# Patient Record
Sex: Male | Born: 1955
Health system: Southern US, Community
[De-identification: ages and names within clinical notes are randomized; demographics above are authoritative.]

## PROBLEM LIST (undated history)

## (undated) DIAGNOSIS — I10 Essential (primary) hypertension: Secondary | ICD-10-CM

## (undated) DIAGNOSIS — E785 Hyperlipidemia, unspecified: Secondary | ICD-10-CM

## (undated) HISTORY — DX: Hyperlipidemia, unspecified: E78.5

## (undated) HISTORY — DX: Essential (primary) hypertension: I10

---

## 1998-12-30 ENCOUNTER — Encounter: Payer: Self-pay | Admitting: Emergency Medicine

## 1998-12-30 ENCOUNTER — Encounter: Payer: Self-pay | Admitting: Specialist

## 1998-12-30 ENCOUNTER — Inpatient Hospital Stay (HOSPITAL_COMMUNITY): Admission: EM | Admit: 1998-12-30 | Discharge: 1999-01-01 | Payer: Self-pay | Admitting: Emergency Medicine

## 2001-03-06 ENCOUNTER — Encounter: Payer: Self-pay | Admitting: Family Medicine

## 2001-03-08 ENCOUNTER — Ambulatory Visit (HOSPITAL_COMMUNITY): Admission: RE | Admit: 2001-03-08 | Discharge: 2001-03-08 | Payer: Self-pay | Admitting: Gastroenterology

## 2004-09-09 ENCOUNTER — Ambulatory Visit: Payer: Self-pay | Admitting: Family Medicine

## 2004-12-26 ENCOUNTER — Ambulatory Visit: Payer: Self-pay | Admitting: Sports Medicine

## 2005-02-08 ENCOUNTER — Ambulatory Visit: Payer: Self-pay | Admitting: Family Medicine

## 2005-08-14 ENCOUNTER — Encounter: Admission: RE | Admit: 2005-08-14 | Discharge: 2005-08-14 | Payer: Self-pay | Admitting: Specialist

## 2005-11-21 ENCOUNTER — Ambulatory Visit: Payer: Self-pay | Admitting: Family Medicine

## 2006-03-23 ENCOUNTER — Ambulatory Visit: Payer: Self-pay | Admitting: Family Medicine

## 2006-03-23 ENCOUNTER — Encounter (INDEPENDENT_AMBULATORY_CARE_PROVIDER_SITE_OTHER): Payer: Self-pay | Admitting: Family Medicine

## 2006-03-23 LAB — CONVERTED CEMR LAB
BUN: 10 mg/dL (ref 6–23)
CO2: 28 meq/L (ref 19–32)
Calcium: 9.7 mg/dL (ref 8.4–10.5)
Chloride: 102 meq/L (ref 96–112)
Creatinine, Ser: 0.97 mg/dL (ref 0.40–1.50)
Creatinine, Urine: 49.8 mg/dL
Glucose, Bld: 81 mg/dL (ref 70–99)
Microalb Creat Ratio: 6.2 mg/g (ref 0.0–30.0)
Microalb, Ur: 0.31 mg/dL (ref 0.00–1.89)
Potassium: 4.2 meq/L (ref 3.5–5.3)
Sodium: 140 meq/L (ref 135–145)

## 2006-03-29 DIAGNOSIS — E7800 Pure hypercholesterolemia, unspecified: Secondary | ICD-10-CM

## 2006-03-29 DIAGNOSIS — I10 Essential (primary) hypertension: Secondary | ICD-10-CM

## 2006-03-29 DIAGNOSIS — E669 Obesity, unspecified: Secondary | ICD-10-CM | POA: Insufficient documentation

## 2006-09-12 ENCOUNTER — Ambulatory Visit: Payer: Self-pay | Admitting: Family Medicine

## 2006-09-12 DIAGNOSIS — R808 Other proteinuria: Secondary | ICD-10-CM | POA: Insufficient documentation

## 2006-09-12 HISTORY — DX: Other proteinuria: R80.8

## 2006-09-17 ENCOUNTER — Encounter: Payer: Self-pay | Admitting: *Deleted

## 2006-09-26 ENCOUNTER — Encounter (INDEPENDENT_AMBULATORY_CARE_PROVIDER_SITE_OTHER): Payer: Self-pay | Admitting: Family Medicine

## 2006-09-26 ENCOUNTER — Ambulatory Visit: Payer: Self-pay | Admitting: Family Medicine

## 2006-09-26 LAB — CONVERTED CEMR LAB: Microalbumin U total vol: NEGATIVE mg/L

## 2006-09-27 ENCOUNTER — Telehealth: Payer: Self-pay | Admitting: *Deleted

## 2006-09-27 ENCOUNTER — Encounter (INDEPENDENT_AMBULATORY_CARE_PROVIDER_SITE_OTHER): Payer: Self-pay | Admitting: Family Medicine

## 2006-09-27 LAB — CONVERTED CEMR LAB
ALT: 24 units/L (ref 0–53)
AST: 25 units/L (ref 0–37)
Albumin: 4.3 g/dL (ref 3.5–5.2)
Alkaline Phosphatase: 80 units/L (ref 39–117)
BUN: 9 mg/dL (ref 6–23)
CO2: 27 meq/L (ref 19–32)
Calcium: 9.8 mg/dL (ref 8.4–10.5)
Chloride: 100 meq/L (ref 96–112)
Cholesterol: 210 mg/dL — ABNORMAL HIGH (ref 0–200)
Creatinine, Ser: 1.02 mg/dL (ref 0.40–1.50)
Glucose, Bld: 102 mg/dL — ABNORMAL HIGH (ref 70–99)
HDL: 36 mg/dL — ABNORMAL LOW (ref >39)
LDL Cholesterol: 133 mg/dL — ABNORMAL HIGH (ref 0–99)
Potassium: 3.7 meq/L (ref 3.5–5.3)
Sodium: 138 meq/L (ref 135–145)
Total Bilirubin: 0.5 mg/dL (ref 0.3–1.2)
Total CHOL/HDL Ratio: 5.8
Total Protein: 8.3 g/dL (ref 6.0–8.3)
Triglycerides: 204 mg/dL — ABNORMAL HIGH (ref <150)
VLDL: 41 mg/dL — ABNORMAL HIGH (ref 0–40)

## 2006-12-05 ENCOUNTER — Ambulatory Visit: Payer: Self-pay | Admitting: Family Medicine

## 2007-09-23 ENCOUNTER — Ambulatory Visit: Payer: Self-pay | Admitting: Sports Medicine

## 2007-10-21 ENCOUNTER — Encounter: Payer: Self-pay | Admitting: *Deleted

## 2007-11-15 ENCOUNTER — Ambulatory Visit: Payer: Self-pay | Admitting: Family Medicine

## 2007-12-09 LAB — CONVERTED CEMR LAB
HDL: 45 mg/dL
Triglycerides: 131 mg/dL

## 2008-06-01 ENCOUNTER — Telehealth: Payer: Self-pay | Admitting: Family Medicine

## 2008-08-12 ENCOUNTER — Ambulatory Visit: Payer: Self-pay | Admitting: Family Medicine

## 2008-08-12 ENCOUNTER — Encounter: Payer: Self-pay | Admitting: Family Medicine

## 2008-08-12 LAB — CONVERTED CEMR LAB
ALT: 21 units/L (ref 0–53)
AST: 27 units/L (ref 0–37)
Albumin: 3.9 g/dL (ref 3.5–5.2)
Alkaline Phosphatase: 66 units/L (ref 39–117)
Calcium: 8.9 mg/dL (ref 8.4–10.5)
Chloride: 105 meq/L (ref 96–112)
LDL Goal: 130 mg/dL
Potassium: 3.9 meq/L (ref 3.5–5.3)
Sodium: 141 meq/L (ref 135–145)
Total Protein: 7.5 g/dL (ref 6.0–8.3)

## 2008-08-13 ENCOUNTER — Encounter: Payer: Self-pay | Admitting: Family Medicine

## 2008-08-20 ENCOUNTER — Encounter: Payer: Self-pay | Admitting: Family Medicine

## 2008-08-20 ENCOUNTER — Telehealth: Payer: Self-pay | Admitting: Family Medicine

## 2009-01-27 ENCOUNTER — Ambulatory Visit: Payer: Self-pay | Admitting: Family Medicine

## 2009-08-23 ENCOUNTER — Ambulatory Visit: Payer: Self-pay | Admitting: Family Medicine

## 2009-08-23 ENCOUNTER — Encounter: Payer: Self-pay | Admitting: Family Medicine

## 2009-08-23 DIAGNOSIS — M722 Plantar fascial fibromatosis: Secondary | ICD-10-CM | POA: Insufficient documentation

## 2009-08-23 LAB — CONVERTED CEMR LAB
AST: 28 units/L (ref 0–37)
Albumin: 4.1 g/dL (ref 3.5–5.2)
Alkaline Phosphatase: 82 units/L (ref 39–117)
BUN: 13 mg/dL (ref 6–23)
Creatinine, Ser: 0.93 mg/dL (ref 0.40–1.50)
Creatinine,U: 100 mg/dL
Glucose, Bld: 94 mg/dL (ref 70–99)
HCT: 39.9 % (ref 39.0–52.0)
HDL: 41 mg/dL (ref >39)
Hemoglobin: 14 g/dL (ref 13.0–17.0)
LDL Cholesterol: 111 mg/dL — ABNORMAL HIGH (ref 0–99)
MCHC: 35.1 g/dL (ref 30.0–36.0)
MCV: 83.5 fL (ref 78.0–100.0)
Potassium: 3.8 meq/L (ref 3.5–5.3)
RBC: 4.78 M/uL (ref 4.22–5.81)
RDW: 14.1 % (ref 11.5–15.5)
Total CHOL/HDL Ratio: 4.3
Triglycerides: 115 mg/dL (ref <150)

## 2009-08-24 ENCOUNTER — Encounter: Payer: Self-pay | Admitting: Family Medicine

## 2010-03-03 NOTE — Assessment & Plan Note (Signed)
Summary: f/u/KH   Vital Signs:  Patient profile:   55 year old male Height:      70 inches Weight:      275.4 pounds BMI:     39.66 Temp:     98.5 degrees F oral Pulse rate:   77 / minute Pulse rhythm:   regular BP sitting:   120 / 72  (left arm) Cuff size:   large  Vitals Entered By: Loralee Pacas CMA (August 23, 2009 11:02 AM) CC: cpe Comments pain in right heel. pt complains of numbness in right leg in the mornings   Primary Care Provider:  Delbert Harness MD  CC:  cpe.  History of Present Illness: 55 yo here for annual follow-up  HYPERTENSION Meds: Taking and tolerating? Lis/HCTZ Home BP's: yes, less than 120/80 Chest Pain: NO Dyspnea: no LIGHTHEADED/DIZZINESS: NO  left heel pain:  occaisional pain upon wakening in Am on left medial heel.  worse when sitting stilll.  Better once warms up.  Has not taken anythign for it or done anything.  left lateral leg skin numbness.. happens occaisionally when he sits for a long time.  No weakness.  Goes away on its own.   Current Medications (verified): 1)  Lisinopril-Hydrochlorothiazide 20-12.5 Mg Tabs (Lisinopril-Hydrochlorothiazide) .... Take 2 Tablets in Am For Blood Pressure 2)  Endur-Acin 500 Mg  Tbcr (Niacin) .... One Tablet A Day (Self-Prescribed) 3)  Aspir-Low 81 Mg Tbec (Aspirin) .... Take One Tablet Daily (Patient Preference) 4)  [facility]vs Spectravite  Tabs (Multiple Vitamins-Minerals) .... One Multivitamin Daily 5)  Os-Cal 500 + D 500-500 Mg-Unit Chew (Calcium Carbonate-Vitamin D) .... 2 Tabs Daily PMH-FH-SH reviewed-no changes except otherwise noted  Review of Systems      See HPI  Physical Exam  General:  Well-developed,overweight,in no acute distress; alert,appropriate and cooperative throughout examination.  Obese Eyes:  No corneal or conjunctival inflammation noted. EOMI. Perrla.  Mouth:  Oral mucosa and oropharynx without lesions or exudates.  Lungs:  Normal respiratory effort, chest expands symmetrically. Lungs  are clear to auscultation, no crackles or wheezes. Heart:  Normal rate and regular rhythm. S1 and S2 normal without gallop, murmur, click, rub or other extra sounds. Abdomen:  Obese, + bowel sounds Msk:  tenderness on palpation of insertion of plantar fascia. no tenderness on achilles tendon.   Impression & Recommendations:  Problem # 1:  HYPERTENSION, BENIGN SYSTEMIC (ICD-401.1) well controlled.  continue current  His updated medication list for this problem includes:    Lisinopril-hydrochlorothiazide 20-12.5 Mg Tabs (Lisinopril-hydrochlorothiazide) .Marland Kitchen... Take 2 tablets in am for blood pressure  Orders: Comp Met-[facility] (16109-60454) CBC-[facility] (09811) [facility]- Est  Level 4 (91478)  Problem # 2:  HYPERCHOLESTEROLEMIA (ICD-272.0) Will recheck today.  has been taking niacin due to preference it seems.  Will reveiw FLP and add statin if needed.  His updated medication list for this problem includes:    Endur-acin 500 Mg Tbcr (Niacin) ..... One tablet a day (self-prescribed)  Orders: Comp Met-[facility] (29562-13086) Lipid-[facility] (57846-96295) CBC-[facility] (28413) [facility]- Est  Level 4 (24401)  Problem # 3:  OBESITY, NOS (ICD-278.00) Discussed this as the major modificable risk factor for him at this time.  Also making plantar fascitis and meralgia paresthetica worse. Given nutritional handout, discussed nutrion counseling if he would like to at a future date.  Problem # 4:  PLANTAR FASCIITIS, RIGHT (ICD-728.71)  given patient info on self-care.  Mild pain no pain meds needed.  If becomes worse asked to come back for further tx  Orders: [facility]- Est  Level 4 (40347)  Problem # 5:  Preventive Health Care (ICD-V70.0) pt to call his GI for screening colonoscopy.  UTD on tetanus.  Will check PSA today.  Complete Medication List: 1)  Lisinopril-hydrochlorothiazide 20-12.5 Mg Tabs (Lisinopril-hydrochlorothiazide) .... Take 2 tablets in am for blood pressure 2)  Endur-acin 500 Mg Tbcr (Niacin) .... One tablet a  day (self-prescribed) 3)  Aspir-low 81 Mg Tbec (Aspirin) .... Take one tablet daily (patient preference) 4)  Cvs Spectravite Tabs (Multiple vitamins-minerals) .... One multivitamin daily 5)  Os-cal 500 + D 500-500 Mg-unit Chew (Calcium carbonate-vitamin d) .... 2 tabs daily  Other Orders: UA Microalbumin-[facility] (42595) PSA-[facility] [phone])  Patient Instructions: 1)  See information on plantar fascitis  2)  meralgia parasthetica: numbness on the outside of your thigh- avoid tight clothing, staying in one position, work on weight loss. 3)  See handout from our nutritionist 4)  Exercise 5 times weekly 5)  Make appt to have colonoscopy with Dr. Madilyn Fireman- gastroenterology 6)  Work on healthy nutrition and weight loss- I would be glad to see you or to refer you to a nutritionist for more help with this. 7)  Follow-up in 6 months or sooner if needed. Prescriptions: LISINOPRIL-HYDROCHLOROTHIAZIDE 20-12.5 MG TABS (LISINOPRIL-HYDROCHLOROTHIAZIDE) take 2 tablets in AM for blood pressure  #180 x 2   Entered and Authorized by:   Delbert Harness MD   Signed by:   Delbert Harness MD on 08/23/2009   Method used:   Print then Give to Patient   RxID:   [id number]    Prevention & Chronic Care Immunizations   Influenza vaccine: Fluvax Non-MCR  (01/27/2009)   Influenza vaccine due: 11/14/2008    Tetanus booster: 08/12/2008: Tdap   Tetanus booster due: 08/13/2018    Pneumococcal vaccine: Not documented  Colorectal Screening   Hemoccult: Not documented    Colonoscopy: Not documented  Other Screening   PSA: 0.34  (08/12/2008)   PSA ordered.   PSA due due: 08/12/2009   Smoking status: never  (08/12/2008)  Lipids   Total Cholesterol: 167  (12/09/2007)   LDL: 96  (12/09/2007)   LDL Direct: Not documented   HDL: 45  (12/09/2007)   Triglycerides: 131  (12/09/2007)    SGOT (AST): 27  (08/12/2008)   SGPT (ALT): 21  (08/12/2008) CMP ordered    Alkaline phosphatase: 66  (08/12/2008)   Total  bilirubin: 0.3  (08/12/2008)  Hypertension   Last Blood Pressure: 120 / 72  (08/23/2009)   Serum creatinine: 0.96  (08/12/2008)   Serum potassium 3.9  (08/12/2008) CMP ordered   Self-Management Support :    Hypertension self-management support: Not documented    Lipid self-management support: Not documented   Laboratory Results   Urine Tests  Date/Time Received: August 23, 2009 11:47 AM  Date/Time Reported: August 23, 2009 12:03 PM   Microalbumin (urine): 10 mg/L Creatinine: 100mg /dL  A:C Ratio <10 Normal Comments: ...........test performed by...........Marland KitchenTerese Door, CMA

## 2010-03-03 NOTE — Letter (Signed)
Summary: Lipid Letter  [facility]  [address]   [place], [place] [address]   Phone: [phone]  Fax: [phone]    08/24/2009  Adam Caldwell [address] [place], [place]  [address]  Dear Adam Caldwell:  We have carefully reviewed your last lipid profile from 12/09/2007 and 08/23/2009 and the results are noted below with a summary of recommendations for lipid management.     Cholesterol:       175 (was 167)     Goal: <200   HDL "good" Cholesterol:   41 (was 43)      Goal: >40   LDL "bad" Cholesterol:   111 (was 96)     Goal: <130   Triglycerides:       115       Goal: <150    I do not think we need to start any medication at this time.  We recommend improving on your TLC diet and Adjunctive Measures (see below) and make the following changes to your regimen:    TLC Diet (Therapeutic Lifestyle Change): Saturated Fats & Transfatty acids should be kept < 7% of total calories ***Reduce Saturated Fats Polyunstaurated Fat can be up to 10% of total calories Monounsaturated Fat Fat can be up to 20% of total calories Total Fat should be no greater than 25-35% of total calories Carbohydrates should be 50-60% of total calories Protein should be approximately 15% of total calories Fiber should be at least 20-30 grams a day ***Increased fiber may help lower LDL Total Cholesterol should be < 200mg /day Consider adding plant stanol/sterols to diet (example: Benacol spread) ***A higher intake of unsaturated fat may reduce Triglycerides and Increase HDL    Adjunctive Measures (may lower LIPIDS and reduce risk of Heart Attack) include: Aerobic Exercise (20-30 minutes 3-4 times a week) Limit Alcohol Consumption Weight Reduction Aspirin 75-81 mg a day by mouth (if not allergic or contraindicated) Dietary Fiber 20-30 grams a day by mouth    Your labwork for your prostate, Complete blood count and comprehensive metabolic panel were all normal.  If you have any questions,  please call. We appreciate being able to work with you.   Sincerely,    [facility] Delbert Harness MD  Appended Document: Lipid Letter mailed

## 2010-03-14 ENCOUNTER — Encounter: Payer: Self-pay | Admitting: *Deleted

## 2010-06-17 NOTE — Procedures (Signed)
[facility]  Patient:    [name], [name] Visit Number: [id number] MRN: [id number]          Service Type: END Location: ENDO Attending Physician:  Louie Bun Dictated by:   Everardo All Madilyn Fireman, M.D. Proc. Date: 03/08/01 Admit Date:  03/08/2001   CC:         Lesle Chris, M.D. [facility]   Procedure Report  PROCEDURE:  Colonoscopy.  INDICATION FOR PROCEDURE:  Heme positive stools.  DESCRIPTION OF PROCEDURE:  The patient was placed in the left lateral decubitus position and placed on the pulse monitor with continuous low flow oxygen delivered via nasal cannula. She was sedated with 100 mg IV Demerol and 10 mg IV Versed. The Olympus video colonoscope was inserted into the rectum and advanced to the cecum, confirmed by transillumination at McBurneys point and visualization of the ileocecal valve and appendiceal orifice. The prep was good in general but somewhat limited proximal to the hepatic flexure, and I could not rule out small lesions less than 1 cm in diameter in all areas. Otherwise, the cecum, ascending, transverse, descending, and sigmoid colon all appeared normal with no masses, polyps, diverticula or other mucosal abnormalities. The rectum likewise appeared normal and retroflex view of the anus revealed small internal hemorrhoids. The colonoscope was then withdrawn and the patient returned to the recovery room in stable condition. He tolerated the procedure well and there were no immediate complications.  IMPRESSION:  Internal hemorrhoids, otherwise normal colonoscopy. Dictated by:   Everardo All Madilyn Fireman, M.D. Attending Physician:  Louie Bun DD:  03/08/01 TD:  03/08/01 Job: [id number]

## 2010-09-20 ENCOUNTER — Encounter: Payer: Self-pay | Admitting: Family Medicine

## 2010-09-20 ENCOUNTER — Ambulatory Visit (INDEPENDENT_AMBULATORY_CARE_PROVIDER_SITE_OTHER): Payer: PRIVATE HEALTH INSURANCE | Admitting: Family Medicine

## 2010-09-20 VITALS — BP 117/82 | HR 77 | Ht 70.0 in | Wt 285.0 lb

## 2010-09-20 DIAGNOSIS — E7800 Pure hypercholesterolemia, unspecified: Secondary | ICD-10-CM

## 2010-09-20 DIAGNOSIS — R490 Dysphonia: Secondary | ICD-10-CM | POA: Insufficient documentation

## 2010-09-20 DIAGNOSIS — I10 Essential (primary) hypertension: Secondary | ICD-10-CM

## 2010-09-20 DIAGNOSIS — E669 Obesity, unspecified: Secondary | ICD-10-CM

## 2010-09-20 LAB — POCT URINALYSIS DIPSTICK
Bilirubin, UA: NEGATIVE
Glucose, UA: NEGATIVE
Ketones, UA: NEGATIVE
Leukocytes, UA: NEGATIVE
Spec Grav, UA: 1.015

## 2010-09-20 LAB — COMPREHENSIVE METABOLIC PANEL
Alkaline Phosphatase: 72 U/L (ref 39–117)
BUN: 13 mg/dL (ref 6–23)
Creat: 1.12 mg/dL (ref 0.50–1.35)
Glucose, Bld: 90 mg/dL (ref 70–99)
Sodium: 139 mEq/L (ref 135–145)
Total Bilirubin: 0.5 mg/dL (ref 0.3–1.2)

## 2010-09-20 LAB — LIPID PANEL
HDL: 41 mg/dL (ref >39)
LDL Cholesterol: 108 mg/dL — ABNORMAL HIGH (ref 0–99)
Total CHOL/HDL Ratio: 4.2 Ratio

## 2010-09-20 MED ORDER — LISINOPRIL-HYDROCHLOROTHIAZIDE 20-12.5 MG PO TABS: 2.0000 | ORAL_TABLET | Freq: Every day | ORAL | Status: DC

## 2010-09-20 NOTE — Assessment & Plan Note (Addendum)
BMI 40.8. Not signigicant change. He is walking 30 min daily. No interested in nutritional intervetion program at this time. Plan: Discussed benefits of losing weight.

## 2010-09-20 NOTE — Patient Instructions (Addendum)
It has been a pleasure to meet you. Continue with your medication same way you have been taken them.  Start loratadine 10 mg 1 tab daily for 30 days. Make an appointment in a month to re-evaluate or sooner if worsening of hoarseness or other symptoms appear. I will call you if labs results are abnormal. If within normal limits I will mail you a letter with results. This hand out is for you to schedule your colonoscopy.

## 2010-09-20 NOTE — Assessment & Plan Note (Signed)
Last LDL 111, goal under 100. Pt on Niacin self prescribed. Pt was on Lipitor in the past. Plan: We will repeat Lipid Profile and depending results continue with niacin or evaluate for statins.

## 2010-09-20 NOTE — Assessment & Plan Note (Signed)
Well controlled. On Lisinopril/HCTZ (20/12.5) BID. Plan:  Continue treatment.

## 2010-09-20 NOTE — Assessment & Plan Note (Addendum)
Pt traveled to [place] a month ago. Dysphonia  Started after that. Has positive pharyngeal findings corresponding with pharyngitis that could be allergic in etiology. Differential also includes GERD and neoplasia. No hx of acid reflux or malignancies, non smoker. Plan: Antihistaminic second generation. Re-evaluate in a month.

## 2010-09-20 NOTE — Progress Notes (Signed)
  Subjective:    Patient ID: Adam Caldwell, male    DOB: 07-29-1955, 55 y.o.   MRN: [id number]  HPI Pt with hx of Hypertension, Hyperlipidemia and Obesity that comes for a regular check up. He complaints of hoarseness that started a month ago after a visit to [place]. This dysphonia does not change throughout the day. No pain, itchiness or difficulty swallowing. No acid reflux, chest pain or abdominal pain. Afebrile, no shortness of breath, no other symptoms associated with it. He is compliant with his medical treatment. Blood pressure well controlled. Pt. on Niacin self prescribed. Last LDL 111 last year, rest of lipid profile within normal limits. Hx of  Rectal Bleeding in the past with negative colonoscopy in 2001. Interested in scheduling screen colonoscopy. No  GI signs or symptoms present at this time.   Review of Systems Please refer to HPI.     Objective:   Physical Exam  Constitutional: He is oriented to person, place, and time. No distress.  HENT:  Nose: Nose normal.       Oropharynx with erythema no exudates.  Eyes: Conjunctivae and EOM are normal. Pupils are equal, round, and reactive to light.  Cardiovascular: Regular rhythm and normal heart sounds.   Pulmonary/Chest: Breath sounds normal.  Abdominal: Bowel sounds are normal. He exhibits no distension and no mass. There is no tenderness.  Musculoskeletal: He exhibits no edema.  Lymphadenopathy:    He has no cervical adenopathy.  Neurological: He is alert and oriented to person, place, and time. No cranial nerve deficit.          Assessment & Plan:

## 2010-09-22 ENCOUNTER — Encounter: Payer: Self-pay | Admitting: Family Medicine

## 2011-01-20 ENCOUNTER — Ambulatory Visit (INDEPENDENT_AMBULATORY_CARE_PROVIDER_SITE_OTHER): Payer: PRIVATE HEALTH INSURANCE | Admitting: *Deleted

## 2011-01-20 DIAGNOSIS — Z23 Encounter for immunization: Secondary | ICD-10-CM

## 2011-09-21 ENCOUNTER — Ambulatory Visit (INDEPENDENT_AMBULATORY_CARE_PROVIDER_SITE_OTHER): Payer: PRIVATE HEALTH INSURANCE | Admitting: Family Medicine

## 2011-09-21 ENCOUNTER — Encounter: Payer: Self-pay | Admitting: Family Medicine

## 2011-09-21 VITALS — BP 129/67 | HR 92 | Temp 98.6°F | Ht 70.0 in | Wt 291.0 lb

## 2011-09-21 DIAGNOSIS — E7800 Pure hypercholesterolemia, unspecified: Secondary | ICD-10-CM

## 2011-09-21 DIAGNOSIS — E669 Obesity, unspecified: Secondary | ICD-10-CM

## 2011-09-21 DIAGNOSIS — I10 Essential (primary) hypertension: Secondary | ICD-10-CM

## 2011-09-21 DIAGNOSIS — R490 Dysphonia: Secondary | ICD-10-CM

## 2011-09-21 LAB — POCT URINALYSIS DIPSTICK
Bilirubin, UA: NEGATIVE
Ketones, UA: NEGATIVE
Leukocytes, UA: NEGATIVE
Protein, UA: NEGATIVE
Spec Grav, UA: 1.01
pH, UA: 5.5

## 2011-09-21 MED ORDER — LISINOPRIL-HYDROCHLOROTHIAZIDE 20-12.5 MG PO TABS: 2.0000 | ORAL_TABLET | Freq: Every day | ORAL | Status: DC

## 2011-09-21 NOTE — Patient Instructions (Addendum)
It has been a pleasure to see you today. Your blood pressure is well controlled. Continue taking the medications as prescribed. I have placed refill for 12 months I will call you with the labs results if they come back abnormal otherwise you will receive a letter. You will be contacted for ENT appointment. Remember what we talked about your weight. We are here to support and help you when you feel ready to lose weight. Make your next appointment in 6 months or sooner if needed.

## 2011-09-22 LAB — BASIC METABOLIC PANEL
CO2: 29 mEq/L (ref 19–32)
Calcium: 9.8 mg/dL (ref 8.4–10.5)
Chloride: 102 mEq/L (ref 96–112)
Glucose, Bld: 76 mg/dL (ref 70–99)
Sodium: 141 mEq/L (ref 135–145)

## 2011-09-22 LAB — LIPID PANEL
Cholesterol: 176 mg/dL (ref 0–200)
HDL: 42 mg/dL (ref >39)
LDL Cholesterol: 106 mg/dL — ABNORMAL HIGH (ref 0–99)
Total CHOL/HDL Ratio: 4.2 Ratio
Triglycerides: 141 mg/dL (ref <150)
VLDL: 28 mg/dL (ref 0–40)

## 2011-09-22 NOTE — Assessment & Plan Note (Signed)
Prior lipid profiles compared with last year are as follows.  Last year 4 years ago  LDL TG HDL 108 124 41 133 147 36  No LDL at goal yet but obviously pt is improving.  Plan: We will continue Niacin ans will recheck Lipid profile.

## 2011-09-22 NOTE — Progress Notes (Signed)
  Subjective:    Patient ID: Adam Caldwell, male    DOB: Jul 16, 1955, 56 y.o.   MRN: [id number]  HPI Pt comes today for annual visit. He does not have any complaints today. Since last time seen this pt was last year we will addrees his chronic condition at this visit and will follow up dysphonia that is still present on pt.  1. Dysphonia: non smoker, pt started with symptoms a year ago after returning trip from [place]. He sates that this problem comes and goes and attributes his condition to allergies or overusing his voice. Denies pain or other pharyngeal symptoms or neck lumps. Also no other allergy symptoms associated as rhynotrrhea, conjunctival injection. Denies cough, weight loss or other noticeable changes.   2.  HTN: well controlled on HCTZ/Lisinopril (12.5/20). No side effect of medication reported. No chest pain, palpitations, LE edema, SOB or weakness.   3. Hyperlipidemia: on Niacin after unssusscesful trials of several statins. Goal LDL under 100. Last checked 08/2010. No side effects of niacin reported pt tolerates well with no facial flushes, no GI symptoms.  Review of Systems Per HPI    Objective:   Physical Exam Constitutional: He is oriented to person, place, and time. No distress.  HENT:  Nose: Nose normal.  Oropharynx with mild erythema no exudates. No adenopathies palpated. Eyes: normal conjunctiva. Fundoscopy: no hemorrhages or vessel changes seen. Cardiovascular: Regular rhythm and normal heart sounds.  Pulmonary/Chest: Breath sounds normal.  Abdominal: Bowel sounds are normal. There is no tenderness.  Musculoskeletal: no edema.  Neurological: He is alert and oriented to person, place, and time. No cranial nerve deficit     Assessment & Plan:

## 2011-09-22 NOTE — Assessment & Plan Note (Addendum)
Well controlled on HCTZ and Lisinopril. Plan: Continue current plan. Weight management discussed, pt walks 5 times a week for 20 min but still life styles changes regarding diet are a challenge for him. Pt not ready to yet to embrace dietary changes. F/u in 6 months

## 2011-09-22 NOTE — Assessment & Plan Note (Signed)
Addressed under hypertension.

## 2011-09-22 NOTE — Assessment & Plan Note (Signed)
It is concerning to [place] that pt has been more than one year with unchanged symptoms. It was recommended to f/u but pt never came back to address this problem further. Even on today's check up this was not a concern for him. He is a non smoker but the chronicity of this condition warrant at least direct laryngoscopy to evaluate cause of dysphonia. Pt does not have any other allergy symptoms and denies symptoms of GERD. Plan: Discussed with pt and he agrees to ENT evaluation. Referral order placed.

## 2011-10-09 ENCOUNTER — Telehealth: Payer: Self-pay | Admitting: Family Medicine

## 2011-10-09 NOTE — Telephone Encounter (Signed)
Pt is asking for results of his labs.

## 2011-10-10 NOTE — Telephone Encounter (Signed)
Called and informed pt of lab results.  

## 2011-10-10 NOTE — Telephone Encounter (Signed)
Called and informed pt of lab results. Pt asked for these to be sent so that he can keep a record. Will print and mail to him.Loralee Pacas [place]

## 2012-01-19 ENCOUNTER — Ambulatory Visit (INDEPENDENT_AMBULATORY_CARE_PROVIDER_SITE_OTHER): Payer: PRIVATE HEALTH INSURANCE

## 2012-01-19 DIAGNOSIS — Z23 Encounter for immunization: Secondary | ICD-10-CM

## 2012-10-07 ENCOUNTER — Encounter: Payer: Self-pay | Admitting: Family Medicine

## 2012-10-07 ENCOUNTER — Ambulatory Visit (INDEPENDENT_AMBULATORY_CARE_PROVIDER_SITE_OTHER): Payer: PRIVATE HEALTH INSURANCE | Admitting: Family Medicine

## 2012-10-07 VITALS — BP 114/52 | HR 74 | Temp 98.8°F | Ht 70.0 in | Wt 279.0 lb

## 2012-10-07 DIAGNOSIS — E7800 Pure hypercholesterolemia, unspecified: Secondary | ICD-10-CM

## 2012-10-07 DIAGNOSIS — R490 Dysphonia: Secondary | ICD-10-CM

## 2012-10-07 DIAGNOSIS — I10 Essential (primary) hypertension: Secondary | ICD-10-CM

## 2012-10-07 DIAGNOSIS — E669 Obesity, unspecified: Secondary | ICD-10-CM

## 2012-10-07 LAB — BASIC METABOLIC PANEL
BUN: 15 mg/dL (ref 6–23)
CO2: 31 mEq/L (ref 19–32)
Chloride: 100 mEq/L (ref 96–112)
Creat: 1.07 mg/dL (ref 0.50–1.35)
Glucose, Bld: 95 mg/dL (ref 70–99)

## 2012-10-07 LAB — POCT URINALYSIS DIPSTICK
Blood, UA: NEGATIVE
Ketones, UA: NEGATIVE
Protein, UA: NEGATIVE
Spec Grav, UA: 1.01
Urobilinogen, UA: 0.2
pH, UA: 6

## 2012-10-07 LAB — CBC
HCT: 41.2 % (ref 39.0–52.0)
Hemoglobin: 14.1 g/dL (ref 13.0–17.0)
MCV: 80.3 fL (ref 78.0–100.0)
RDW: 14.9 % (ref 11.5–15.5)
WBC: 6.2 10*3/uL (ref 4.0–10.5)

## 2012-10-07 LAB — LIPID PANEL
HDL: 49 mg/dL (ref >39)
LDL Cholesterol: 108 mg/dL — ABNORMAL HIGH (ref 0–99)
Total CHOL/HDL Ratio: 3.5 Ratio

## 2012-10-07 MED ORDER — LISINOPRIL-HYDROCHLOROTHIAZIDE 20-12.5 MG PO TABS: 2.0000 | ORAL_TABLET | Freq: Every day | ORAL | Status: DC

## 2012-10-07 NOTE — Patient Instructions (Addendum)
DASH Diet  The DASH diet stands for "Dietary Approaches to Stop Hypertension." It is a healthy eating plan that has been shown to reduce high blood pressure (hypertension) in as little as 14 days, while also possibly providing other significant health benefits. These other health benefits include reducing the risk of breast cancer after menopause and reducing the risk of type 2 diabetes, heart disease, colon cancer, and stroke. Health benefits also include weight loss and slowing kidney failure in patients with chronic kidney disease.   DIET GUIDELINES  · Limit salt (sodium). Your diet should contain less than 1500 mg of sodium daily.  · Limit refined or processed carbohydrates. Your diet should include mostly whole grains. Desserts and added sugars should be used sparingly.  · Include small amounts of heart-healthy fats. These types of fats include nuts, oils, and tub margarine. Limit saturated and trans fats. These fats have been shown to be harmful in the body.  CHOOSING FOODS   The following food groups are based on a 2000 calorie diet. See your Registered Dietitian for individual calorie needs.  Grains and Grain Products (6 to 8 servings daily)  · Eat More Often: Whole-wheat bread, brown rice, whole-grain or wheat pasta, quinoa, popcorn without added fat or salt (air popped).  · Eat Less Often: White bread, white pasta, white rice, cornbread.  Vegetables (4 to 5 servings daily)  · Eat More Often: Fresh, frozen, and canned vegetables. Vegetables may be raw, steamed, roasted, or grilled with a minimal amount of fat.  · Eat Less Often/Avoid: Creamed or fried vegetables. Vegetables in a cheese sauce.  Fruit (4 to 5 servings daily)  · Eat More Often: All fresh, canned (in natural juice), or frozen fruits. Dried fruits without added sugar. One hundred percent fruit juice (½ cup [237 mL] daily).  · Eat Less Often: Dried fruits with added sugar. Canned fruit in light or heavy syrup.  Lean Meats, Fish, and Poultry (2  servings or less daily. One serving is 3 to 4 oz [85-114 g]).  · Eat More Often: Ninety percent or leaner ground beef, tenderloin, sirloin. Round cuts of beef, chicken breast, turkey breast. All fish. Grill, bake, or broil your meat. Nothing should be fried.  · Eat Less Often/Avoid: Fatty cuts of meat, turkey, or chicken leg, thigh, or wing. Fried cuts of meat or fish.  Dairy (2 to 3 servings)  · Eat More Often: Low-fat or fat-free milk, low-fat plain or light yogurt, reduced-fat or part-skim cheese.  · Eat Less Often/Avoid: Milk (whole, 2%). Whole milk yogurt. Full-fat cheeses.  Nuts, Seeds, and Legumes (4 to 5 servings per week)  · Eat More Often: All without added salt.  · Eat Less Often/Avoid: Salted nuts and seeds, canned beans with added salt.  Fats and Sweets (limited)  · Eat More Often: Vegetable oils, tub margarines without trans fats, sugar-free gelatin. Mayonnaise and salad dressings.  · Eat Less Often/Avoid: Coconut oils, palm oils, butter, stick margarine, cream, half and half, cookies, candy, pie.  FOR MORE INFORMATION  The Dash Diet Eating Plan: www.dashdiet.org  Document Released: 01/05/2011 Document Revised: 04/10/2011 Document Reviewed: 01/05/2011  ExitCare® Patient Information ©2014 [employer].

## 2012-10-07 NOTE — Assessment & Plan Note (Signed)
Intentionally lost 12 lb in 1 year changing his life style.  P/ Positive reinforcement Encourage to add exercise to his regimen.

## 2012-10-07 NOTE — Assessment & Plan Note (Addendum)
Controlled with current treatment. P/ Cbc, bmet, lipid profile monitoring and UA(pt reports remote hx of proteinuria?)

## 2012-10-07 NOTE — Assessment & Plan Note (Signed)
Lipid profile ordered. Will evaluate with new guidelines to determine risk and recommend appropriate therapy.

## 2012-10-07 NOTE — Progress Notes (Signed)
Family Medicine Office Visit Note   Subjective:   Patient ID: Adam Caldwell, male  DOB: [birth year], 56 y.o.. MRN: [id number]   Pt that comes today for his annual check up. He has no current complaints. Regarding his dysphonia he states went to ENT who diagnosed him with a "cyst" on his vocal cords and recommended surgical removal if he continued with symptoms. Pt reports his voice has returned to normal and he is not interested at this time in pursuing surgery.  Pt with PMHx significant for HTN, HLD and obesity. Reports compliance. He also has change his diet since last appointment (2013) and has intentionally loss 12 Lb.  -Colonoscopy: pt  Reports never had it done. Denies FHx of colon cancer. Non smoker.  Discussed importance of screening procedure. -takes ASA daily   Review of Systems:  Pt denies SOB, chest pain, palpitations, headaches, dizziness, numbness or weakness. No changes on urinary or BM habits.  Objective:   Physical Exam: Gen:  Obese, NAD HEENT: Moist mucous membranes  CV: Regular rate and rhythm, no murmurs rubs or gallops PULM: Clear to auscultation bilaterally. No wheezes/rales/rhonchi ABD: Soft, non tender, non distended, normal bowel sounds EXT: No edema Rectal exam declined today. Pt reports had one done at [facility] in April that was reported to him as negative. Neuro: Alert and oriented x3. No focalization  Assessment & Plan:

## 2012-10-21 ENCOUNTER — Telehealth: Payer: Self-pay | Admitting: Family Medicine

## 2012-10-21 NOTE — Telephone Encounter (Signed)
Pt stated that he needed his results from Sept 8th from labs also physical results.

## 2012-10-21 NOTE — Telephone Encounter (Signed)
Will forward to Dr Aviva Signs

## 2012-10-22 ENCOUNTER — Encounter: Payer: Self-pay | Admitting: Family Medicine

## 2012-10-22 NOTE — Telephone Encounter (Signed)
All his labs are within normal limits. I can try to print a copy of his labs and he can pick it up here.

## 2012-10-23 NOTE — Telephone Encounter (Signed)
Pt advised all labs normal and copy mailed to pt per request.

## 2013-01-29 ENCOUNTER — Ambulatory Visit: Payer: PRIVATE HEALTH INSURANCE

## 2013-01-29 ENCOUNTER — Ambulatory Visit (INDEPENDENT_AMBULATORY_CARE_PROVIDER_SITE_OTHER): Payer: PRIVATE HEALTH INSURANCE | Admitting: *Deleted

## 2013-01-29 DIAGNOSIS — Z23 Encounter for immunization: Secondary | ICD-10-CM

## 2013-02-03 ENCOUNTER — Ambulatory Visit: Payer: PRIVATE HEALTH INSURANCE

## 2013-10-20 ENCOUNTER — Ambulatory Visit (INDEPENDENT_AMBULATORY_CARE_PROVIDER_SITE_OTHER): Payer: PRIVATE HEALTH INSURANCE | Admitting: Family Medicine

## 2013-10-20 ENCOUNTER — Encounter: Payer: Self-pay | Admitting: Family Medicine

## 2013-10-20 VITALS — BP 117/62 | HR 81 | Temp 98.9°F | Ht 70.0 in | Wt 294.9 lb

## 2013-10-20 DIAGNOSIS — Z1211 Encounter for screening for malignant neoplasm of colon: Secondary | ICD-10-CM

## 2013-10-20 DIAGNOSIS — R7989 Other specified abnormal findings of blood chemistry: Secondary | ICD-10-CM | POA: Insufficient documentation

## 2013-10-20 DIAGNOSIS — Z23 Encounter for immunization: Secondary | ICD-10-CM

## 2013-10-20 DIAGNOSIS — Z0000 Encounter for general adult medical examination without abnormal findings: Secondary | ICD-10-CM | POA: Insufficient documentation

## 2013-10-20 DIAGNOSIS — E7800 Pure hypercholesterolemia, unspecified: Secondary | ICD-10-CM

## 2013-10-20 DIAGNOSIS — I10 Essential (primary) hypertension: Secondary | ICD-10-CM

## 2013-10-20 LAB — CBC
HEMATOCRIT: 39.1 % (ref 39.0–52.0)
HEMOGLOBIN: 13.3 g/dL (ref 13.0–17.0)
MCH: 27.8 pg (ref 26.0–34.0)
MCHC: 34 g/dL (ref 30.0–36.0)
MCV: 81.6 fL (ref 78.0–100.0)
Platelets: 159 10*3/uL (ref 150–400)
RBC: 4.79 MIL/uL (ref 4.22–5.81)
RDW: 14.2 % (ref 11.5–15.5)
WBC: 6.8 10*3/uL (ref 4.0–10.5)

## 2013-10-20 LAB — BASIC METABOLIC PANEL
BUN: 18 mg/dL (ref 6–23)
CHLORIDE: 104 meq/L (ref 96–112)
CO2: 29 mEq/L (ref 19–32)
CREATININE: 1.05 mg/dL (ref 0.50–1.35)
Calcium: 9.4 mg/dL (ref 8.4–10.5)
Glucose, Bld: 86 mg/dL (ref 70–99)
POTASSIUM: 3.6 meq/L (ref 3.5–5.3)
SODIUM: 139 meq/L (ref 135–145)

## 2013-10-20 LAB — LIPID PANEL
CHOL/HDL RATIO: 3.5 ratio
Cholesterol: 152 mg/dL (ref 0–200)
HDL: 44 mg/dL (ref >39)
LDL Cholesterol: 90 mg/dL (ref 0–99)
Triglycerides: 92 mg/dL (ref <150)
VLDL: 18 mg/dL (ref 0–40)

## 2013-10-20 LAB — POCT GLYCOSYLATED HEMOGLOBIN (HGB A1C): HEMOGLOBIN A1C: 6.4

## 2013-10-20 MED ORDER — LISINOPRIL-HYDROCHLOROTHIAZIDE 20-12.5 MG PO TABS: 2.0000 | ORAL_TABLET | Freq: Every day | ORAL | Status: DC

## 2013-10-20 NOTE — Assessment & Plan Note (Signed)
-  A1C was 6.2 in 2013, qualifying Mr. Adam Caldwell for prediabetes -Will recheck A1C today to monitor status

## 2013-10-20 NOTE — Assessment & Plan Note (Signed)
-  Counseled on weight loss and exercise -[employer] information given -No changes in diagnoses, medications, or family history since last visit.

## 2013-10-20 NOTE — Progress Notes (Addendum)
Subjective:     Patient ID: Adam Caldwell, male   DOB: 11/06/1955, 58 y.o.   MRN: [id number]  HPI Adam Caldwell is a 58yo male presenting today for his annual physical.  No acute complaints today.    State he watches his diet and exercises by walking every day, but has not been able to exercise for the past few days.  He continues to deny history of smoking and alcohol use.  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 He states that this has made it difficult for him to go for a colonoscopy.  His last colonoscopy was in 2001.  He would like to have a FOBT done and then decide if he will go for colonoscopy, but he is willing to go for colonoscopy if necessary.  He denies any changes in medical history, medications, or family history since last year.  He has a past medical history of HTN, hypercholesterolemia, and dysphonia.  He currently takes Lisinopril-HCTZ 20-12.5 for his blood pressure and he needs a refill of this medication.  He currently takes Niacin for his cholesterol.  Last lipid panel was on 10/07/12 and showed cholesterol 171, triglycerides 69, HDL 49, and LDL 108; his lipid panels have been stable for 5 years. 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 He takes Calcium Carbonate-Vitamin D due to family history of osteoporosis. His last CBC and CMP were on 10/07/12 and were normal.  His last A1C was 6.2 on 09/20/13, qualifying him for pre-diabetes at that time.  He would like his cholesterol and other blood work checked today.  Review of Systems  HENT:       Denies dysphonia  Respiratory: Negative for shortness of breath.   Cardiovascular: Negative for chest pain and leg swelling.  Endocrine: Negative for polydipsia, polyphagia and polyuria.  Genitourinary: Negative for dysuria,  urgency, frequency, hematuria and difficulty urinating.       Objective:   Physical Exam  Constitutional: He is oriented to person, place, and time. He appears well-developed and well-nourished. No distress.  HENT:  Head: Normocephalic and atraumatic.  Mouth/Throat: Oropharynx is clear and moist. No oropharyngeal exudate.  Cardiovascular: Normal rate, regular rhythm and normal heart sounds.  Exam reveals no gallop and no friction rub.   No murmur heard. Pulmonary/Chest: Effort normal and breath sounds normal. No respiratory distress. He has no wheezes. He has no rales.  Abdominal: Soft. Bowel sounds are normal. He exhibits no distension and no mass. There is no tenderness.  Musculoskeletal: He exhibits no edema.  Neurological: He is alert and oriented to person, place, and time.  Skin: Skin is warm and dry. No rash noted.  Psychiatric: He has a normal mood and affect. His behavior is normal.       Assessment:     Please refer to Problem List for Assessment.     Plan:     Please refer to Problem List for Plan.

## 2013-10-20 NOTE — Assessment & Plan Note (Addendum)
-  Stable today at 117/62 -Refill of Lisinopril-HCTZ 20-12.5mg  sent to pharmacy -Checking CBC and BMP today  -Addend: CBC and BMP both within normal limits

## 2013-10-20 NOTE — Patient Instructions (Signed)
Thank you so much for coming to visit me today! I've put in for you to have some labs checked today!  I will let you know if any of them come back as abnormal! I've also arranged for you to check your stool for blood at home.  If this is abnormal, we can consider obtaining a colonoscopy. I've refilled your blood pressure medicine today.  It was great today at 117/62! Continue to work on your diet and exercise!  Thanks again!   Dr. Caroleen Hamman

## 2013-10-20 NOTE — Assessment & Plan Note (Signed)
-   Last colonoscopy in 2001 - FOBT given to Adam Caldwell today  -If abnormal, will follow up with colonoscopy  -If normal, recheck in 30yr

## 2013-10-20 NOTE — Assessment & Plan Note (Addendum)
-  Last Cholesterol checked on 10/07/13. Recheck again today.  -Addend:  Lipid Panel within normal limits Lipid Panel     Component Value Date/Time   CHOL 152 10/20/2013 1450   TRIG 92 10/20/2013 1450   HDL 44 10/20/2013 1450   CHOLHDL 3.5 10/20/2013 1450   VLDL 18 10/20/2013 1450   LDLCALC 90 10/20/2013 1450  -Currently on Niacin.  No refill needed today -Will calculate ASCVD score once results are in  -Addend:  ASCVD Score=9.7% 10 year risk   -Moderate to High Intensity Statin recommended   -Prescription for Atorvastatin  sent to Pharmacy

## 2013-10-20 NOTE — Addendum Note (Signed)
Addended by: Araceli Bouche on: 10/20/2013 02:49 PM   Modules accepted: Level of Service

## 2013-10-21 MED ORDER — ATORVASTATIN CALCIUM 40 MG PO TABS: 40.0000 mg | ORAL_TABLET | Freq: Every day | ORAL | Status: DC

## 2013-10-21 NOTE — Addendum Note (Signed)
Addended by: Araceli Bouche on: 10/21/2013 04:49 PM   Modules accepted: Orders

## 2013-10-23 ENCOUNTER — Telehealth: Payer: Self-pay | Admitting: *Deleted

## 2013-10-23 NOTE — Telephone Encounter (Signed)
Pt informed and agreeable.  I also faxed a "diabetic diet" info sheet to him.    Dr. Caroleen Hamman, Pt states that the lipitor is too expensive.  He said for a 90 day supply it is $90.  Wants to know if this can be changed. Jamye Balicki, Maryjo [place]

## 2013-10-23 NOTE — Telephone Encounter (Signed)
Message copied by Osborne Oman on Thu Oct 23, 2013  4:50 PM ------      Message from: Araceli Bouche      Created: Wed Oct 22, 2013  2:58 PM       Attempted to contact Adam Caldwell on both his home phone and mobile phone with no success.  Please let him know that his BMP, CBC, and Lipid Panel all look great!  I've calculated his risk of heart attack and stroke and it is 9.7% within the next 10 years, so I've sent in a prescription of Atorvastatin in to his pharmacy to help lower his risk.  His A1C is also 6.4 so he is dangerously close to being diagnosed as diabetic.  Please encourage him to continue working on his diet and try to exercise more to prevent progression into diabetes!            Thanks!        ------

## 2013-10-24 ENCOUNTER — Telehealth: Payer: Self-pay | Admitting: Family Medicine

## 2013-10-24 MED ORDER — LOVASTATIN 20 MG PO TABS: 40.0000 mg | ORAL_TABLET | Freq: Every day | ORAL | Status: DC

## 2013-10-24 NOTE — Telephone Encounter (Signed)
Could not afford statin prescribed.  Sent prescription for Lipitor  to [facility] since it is on the $10 plan.  Should take 2 tablets ( ) per day.

## 2013-11-10 LAB — POC HEMOCCULT BLD/STL (HOME/3-CARD/SCREEN)
Card #3 Fecal Occult Blood, POC: NEGATIVE
FECAL OCCULT BLD: NEGATIVE
Fecal Occult Blood, POC: POSITIVE

## 2013-11-10 NOTE — Addendum Note (Signed)
Addended by: SwazilandJORDAN, Jahniah Pallas on: 11/10/2013 03:04 PM   Modules accepted: Orders

## 2013-11-17 ENCOUNTER — Encounter: Payer: Self-pay | Admitting: Family Medicine

## 2013-11-17 ENCOUNTER — Telehealth: Payer: Self-pay | Admitting: *Deleted

## 2013-11-17 NOTE — Telephone Encounter (Signed)
Message copied by Osborne OmanFLEEGER, Natashia Roseman D on Mon Nov 17, 2013  4:37 PM ------      Message from: Araceli BoucheUMLEY, [place] N      Created: Mon Nov 17, 2013  4:05 PM       Please let [name] know his FOBT results (one out of the three was + for bleeding).  I would recommend following up these results with a colonoscopy.  Please ask [name] if he would be willing to go for colonoscopy.  Thanks!            Dr. Caroleen Hammanumley ------

## 2013-11-17 NOTE — Telephone Encounter (Signed)
LMOVM for pt to return call .Fran Mcree Dawn  

## 2013-11-18 NOTE — Telephone Encounter (Signed)
Spoke with patient and he is going to check with his insurance first about this exam due to him not working at the moment

## 2014-04-13 ENCOUNTER — Other Ambulatory Visit: Payer: Self-pay | Admitting: Family Medicine

## 2014-06-10 ENCOUNTER — Telehealth: Payer: Self-pay | Admitting: Family Medicine

## 2014-06-10 NOTE — Telephone Encounter (Signed)
[facility]

## 2014-06-11 ENCOUNTER — Other Ambulatory Visit: Payer: Self-pay | Admitting: *Deleted

## 2014-06-11 MED ORDER — LOVASTATIN 20 MG PO TABS: 40.0000 mg | ORAL_TABLET | Freq: Every day | ORAL | Status: DC

## 2014-08-13 ENCOUNTER — Other Ambulatory Visit: Payer: Self-pay | Admitting: Family Medicine

## 2014-08-13 NOTE — Telephone Encounter (Signed)
Pt called and needs a refill on his Lovastatin called in. jw

## 2014-10-12 ENCOUNTER — Other Ambulatory Visit: Payer: Self-pay | Admitting: Family Medicine

## 2014-12-02 ENCOUNTER — Encounter: Payer: Self-pay | Admitting: Family Medicine

## 2014-12-02 ENCOUNTER — Ambulatory Visit (INDEPENDENT_AMBULATORY_CARE_PROVIDER_SITE_OTHER): Payer: BLUE CROSS/BLUE SHIELD | Admitting: Family Medicine

## 2014-12-02 ENCOUNTER — Encounter: Payer: PRIVATE HEALTH INSURANCE | Admitting: Family Medicine

## 2014-12-02 VITALS — BP 140/78 | HR 80 | Temp 97.8°F | Ht 70.0 in | Wt 298.0 lb

## 2014-12-02 DIAGNOSIS — I10 Essential (primary) hypertension: Secondary | ICD-10-CM

## 2014-12-02 DIAGNOSIS — Z0000 Encounter for general adult medical examination without abnormal findings: Secondary | ICD-10-CM

## 2014-12-02 DIAGNOSIS — M722 Plantar fascial fibromatosis: Secondary | ICD-10-CM

## 2014-12-02 DIAGNOSIS — E785 Hyperlipidemia, unspecified: Secondary | ICD-10-CM

## 2014-12-02 DIAGNOSIS — E7800 Pure hypercholesterolemia, unspecified: Secondary | ICD-10-CM

## 2014-12-02 DIAGNOSIS — Z1211 Encounter for screening for malignant neoplasm of colon: Secondary | ICD-10-CM

## 2014-12-02 DIAGNOSIS — Z23 Encounter for immunization: Secondary | ICD-10-CM

## 2014-12-02 DIAGNOSIS — R69 Illness, unspecified: Secondary | ICD-10-CM | POA: Diagnosis not present

## 2014-12-02 LAB — CBC
HCT: 39 % (ref 39.0–52.0)
HEMOGLOBIN: 13 g/dL (ref 13.0–17.0)
MCH: 27.4 pg (ref 26.0–34.0)
MCHC: 33.3 g/dL (ref 30.0–36.0)
MCV: 82.1 fL (ref 78.0–100.0)
MPV: 11.2 fL (ref 8.6–12.4)
Platelets: 181 10*3/uL (ref 150–400)
RBC: 4.75 MIL/uL (ref 4.22–5.81)
RDW: 14 % (ref 11.5–15.5)
WBC: 5.6 10*3/uL (ref 4.0–10.5)

## 2014-12-02 LAB — BASIC METABOLIC PANEL WITH GFR
BUN: 14 mg/dL (ref 7–25)
CO2: 29 mmol/L (ref 20–31)
Calcium: 9.4 mg/dL (ref 8.6–10.3)
Chloride: 101 mmol/L (ref 98–110)
Creat: 0.93 mg/dL (ref 0.70–1.33)
GFR, Est African American: >89 mL/min (ref >=60)
GFR, Est Non African American: >89 mL/min (ref >=60)
Glucose, Bld: 108 mg/dL — ABNORMAL HIGH (ref 65–99)
POTASSIUM: 3.8 mmol/L (ref 3.5–5.3)
Sodium: 137 mmol/L (ref 135–146)

## 2014-12-02 LAB — LIPID PANEL
Cholesterol: 133 mg/dL (ref 125–200)
HDL: 47 mg/dL (ref >=40)
LDL CALC: 71 mg/dL (ref <130)
TRIGLYCERIDES: 77 mg/dL (ref <150)
Total CHOL/HDL Ratio: 2.8 Ratio (ref <=5.0)
VLDL: 15 mg/dL (ref <30)

## 2014-12-02 MED ORDER — NAPROXEN 500 MG PO TABS: 500.0000 mg | ORAL_TABLET | Freq: Two times a day (BID) | ORAL | Status: DC

## 2014-12-02 NOTE — Patient Instructions (Signed)
Thank for coming to visit me today! We will get several labs today. I will mail you a letter with the results. I have also placed a referral to GI for your colonoscopy...they will contact you to set up an appointment. I suspect your foot pain is due to plantar fasciitis. I have sent in a medication for you to take for two weeks to help with the pain and inflammation. Please try to avoid walking around without shoes. You may also try heel or arch supports for your shoes. You may ice the foot using a water bottle from the freezer.   Please let me know if there is anything else we can do for you! Thanks again! Dr. Caroleen Hammanumley  Plantar Fasciitis With Rehab The plantar fascia is a fibrous, ligament-like, soft-tissue structure that spans the bottom of the foot. Plantar fasciitis, also called heel spur syndrome, is a condition that causes pain in the foot due to inflammation of the tissue. SYMPTOMS   Pain and tenderness on the underneath side of the foot.  Pain that worsens with standing or walking. CAUSES  Plantar fasciitis is caused by irritation and injury to the plantar fascia on the underneath side of the foot. Common mechanisms of injury include:  Direct trauma to bottom of the foot.  Damage to a small nerve that runs under the foot where the main fascia attaches to the heel bone.  Stress placed on the plantar fascia due to bone spurs. RISK INCREASES WITH:   Activities that place stress on the plantar fascia (running, jumping, pivoting, or cutting).  Poor strength and flexibility.  Improperly fitted shoes.  Tight calf muscles.  Flat feet.  Failure to warm-up properly before activity.  Obesity. PREVENTION  Warm up and stretch properly before activity.  Allow for adequate recovery between workouts.  Maintain physical fitness:  Strength, flexibility, and endurance.  Cardiovascular fitness.  Maintain a health body weight.  Avoid stress on the plantar fascia.  Wear properly  fitted shoes, including arch supports for individuals who have flat feet. PROGNOSIS  If treated properly, then the symptoms of plantar fasciitis usually resolve without surgery. However, occasionally surgery is necessary. RELATED COMPLICATIONS   Recurrent symptoms that may result in a chronic condition.  Problems of the lower back that are caused by compensating for the injury, such as limping.  Pain or weakness of the foot during push-off following surgery.  Chronic inflammation, scarring, and partial or complete fascia tear, occurring more often from repeated injections. TREATMENT  Treatment initially involves the use of ice and medication to help reduce pain and inflammation. The use of strengthening and stretching exercises may help reduce pain with activity, especially stretches of the Achilles tendon. These exercises may be performed at home or with a therapist. Your caregiver may recommend that you use heel cups of arch supports to help reduce stress on the plantar fascia. Occasionally, corticosteroid injections are given to reduce inflammation. If symptoms persist for greater than 6 months despite non-surgical (conservative), then surgery may be recommended.  MEDICATION   If pain medication is necessary, then nonsteroidal anti-inflammatory medications, such as aspirin and ibuprofen, or other minor pain relievers, such as acetaminophen, are often recommended.  Do not take pain medication within 7 days before surgery.  Prescription pain relievers may be given if deemed necessary by your caregiver. Use only as directed and only as much as you need.  Corticosteroid injections may be given by your caregiver. These injections should be reserved for the most serious  cases, because they may only be given a certain number of times. HEAT AND COLD  Cold treatment (icing) relieves pain and reduces inflammation. Cold treatment should be applied for 10 to 15 minutes every 2 to 3 hours for  inflammation and pain and immediately after any activity that aggravates your symptoms. Use ice packs or massage the area with a piece of ice (ice massage).  Heat treatment may be used prior to performing the stretching and strengthening activities prescribed by your caregiver, physical therapist, or athletic trainer. Use a heat pack or soak the injury in warm water. SEEK IMMEDIATE MEDICAL CARE IF:  Treatment seems to offer no benefit, or the condition worsens.  Any medications produce adverse side effects. EXERCISES RANGE OF MOTION (ROM) AND STRETCHING EXERCISES - Plantar Fasciitis (Heel Spur Syndrome) These exercises may help you when beginning to rehabilitate your injury. Your symptoms may resolve with or without further involvement from your physician, physical therapist or athletic trainer. While completing these exercises, remember:   Restoring tissue flexibility helps normal motion to return to the joints. This allows healthier, less painful movement and activity.  An effective stretch should be held for at least 30 seconds.  A stretch should never be painful. You should only feel a gentle lengthening or release in the stretched tissue. RANGE OF MOTION - Toe Extension, Flexion  Sit with your right / left leg crossed over your opposite knee.  Grasp your toes and gently pull them back toward the top of your foot. You should feel a stretch on the bottom of your toes and/or foot.  Hold this stretch for several seconds.  Now, gently pull your toes toward the bottom of your foot. You should feel a stretch on the top of your toes and or foot.  Hold this stretch for several seconds.  RANGE OF MOTION - Ankle Dorsiflexion, Active Assisted  Remove shoes and sit on a chair that is preferably not on a carpeted surface.  Place right / left foot under knee. Extend your opposite leg for support.  Keeping your heel down, slide your right / left foot back toward the chair until you feel a  stretch at your ankle or calf. If you do not feel a stretch, slide your bottom forward to the edge of the chair, while still keeping your heel down.  Hold this stretch for several seconds.  STRETCH - Gastroc, Standing  Place hands on wall.  Extend right / left leg, keeping the front knee somewhat bent.  Slightly point your toes inward on your back foot.  Keeping your right / left heel on the floor and your knee straight, shift your weight toward the wall, not allowing your back to arch.  You should feel a gentle stretch in the right / left calf. Hold this position for several seconds.  STRETCH - Soleus, Standing  Place hands on wall.  Extend right / left leg, keeping the other knee somewhat bent.  Slightly point your toes inward on your back foot.  Keep your right / left heel on the floor, bend your back knee, and slightly shift your weight over the back leg so that you feel a gentle stretch deep in your back calf.  Hold this position for several seconds.  STRETCH - Gastrocsoleus, Standing  Note: This exercise can place a lot of stress on your foot and ankle. Please complete this exercise only if specifically instructed by your caregiver.   Place the ball of your right / left foot  on a step, keeping your other foot firmly on the same step.  Hold on to the wall or a rail for balance.  Slowly lift your other foot, allowing your body weight to press your heel down over the edge of the step.  You should feel a stretch in your right / left calf.  Hold this position for several seconds.  Repeat this exercise with a slight bend in your right / left knee.  STRENGTHENING EXERCISES - Plantar Fasciitis (Heel Spur Syndrome)  These exercises may help you when beginning to rehabilitate your injury. They may resolve your symptoms with or without further involvement from your physician, physical therapist or athletic trainer. While completing these exercises, remember:   Muscles can  gain both the endurance and the strength needed for everyday activities through controlled exercises.  Complete these exercises as instructed by your physician, physical therapist or athletic trainer. Progress the resistance and repetitions only as guided. STRENGTH - Towel Curls  Sit in a chair positioned on a non-carpeted surface.  Place your foot on a towel, keeping your heel on the floor.  Pull the towel toward your heel by only curling your toes. Keep your heel on the floor.  STRENGTH - Ankle Inversion  Secure one end of a rubber exercise band/tubing to a fixed object (table, pole). Loop the other end around your foot just before your toes.  Place your fists between your knees. This will focus your strengthening at your ankle.  Slowly, pull your big toe up and in, making sure the band/tubing is positioned to resist the entire motion.  Hold this position for several seconds.  Have your muscles resist the band/tubing as it slowly pulls your foot back to the starting position.  This information is not intended to replace advice given to you by your health care provider. Make sure you discuss any questions you have with your health care provider.   Document Released: 01/16/2005 Document Revised: 06/02/2014 Document Reviewed: 04/30/2008 Elsevier Interactive Patient Education [employer]

## 2014-12-02 NOTE — Progress Notes (Signed)
Subjective:     Patient ID: Adam Caldwell, male   DOB: 06-14-1955, 59 y.o.   MRN: [id number]  HPI [name] is a 59yo male presenting today for annual exam: # Right Foot Pain: - Notes pain in right heel - Pain has been present since this summer - States pain occurs every day - Worse when getting up in the morning and when rising from sitting or lying for a long time - Has been using Tylenol and Ibuprofen for pain  # Health Maintenance: - No history of colonoscopy. One out of three FOBT positive last year. Agrees to colonoscopy this year. - Would like labs to be checked, including BMP, CBC,and lipid panel  Review of Systems Per HPI    Objective:   Physical Exam  Constitutional: He appears well-developed and well-nourished. No distress.  HENT:  Head: Normocephalic and atraumatic.  Cardiovascular: Normal rate and regular rhythm.  Exam reveals no gallop and no friction rub.   No murmur heard. Pulmonary/Chest: Effort normal. No respiratory distress. He has no wheezes. He has no rales.  Abdominal: Soft. He exhibits no distension. There is no tenderness. There is no rebound.  Musculoskeletal: He exhibits no edema.  Tenderness over plantar fascia of right foot  Psychiatric: He has a normal mood and affect. His behavior is normal.       Assessment and Plan:     HYPERTENSION, BENIGN SYSTEMIC - At goal of less than 150/90 - Continue Lisinopril-HCTZ - Will check BMP and CBC today  HYPERCHOLESTEROLEMIA - Continue Lovastatin - Will check Lipid Panel today  Health care maintenance - GI referral for colonoscopy  Plantar fasciitis of right foot - Short course of NSAIDs for pain - Recommend shoe supports - Stretches given - Ice area

## 2014-12-03 DIAGNOSIS — Z0000 Encounter for general adult medical examination without abnormal findings: Secondary | ICD-10-CM | POA: Insufficient documentation

## 2014-12-03 DIAGNOSIS — Z125 Encounter for screening for malignant neoplasm of prostate: Secondary | ICD-10-CM | POA: Insufficient documentation

## 2014-12-03 DIAGNOSIS — M722 Plantar fascial fibromatosis: Secondary | ICD-10-CM | POA: Insufficient documentation

## 2014-12-03 NOTE — Assessment & Plan Note (Signed)
GI referral for colonoscopy

## 2014-12-03 NOTE — Assessment & Plan Note (Addendum)
Continue Lovastatin. Will check Lipid Panel today. 

## 2014-12-03 NOTE — Assessment & Plan Note (Addendum)
-   Short course of NSAIDs for pain - Recommend shoe supports - Stretches given - Ice area

## 2014-12-03 NOTE — Assessment & Plan Note (Signed)
-   At goal of less than 150/90 - Continue Lisinopril-HCTZ - Will check BMP and CBC today

## 2014-12-04 ENCOUNTER — Encounter: Payer: Self-pay | Admitting: Family Medicine

## 2014-12-11 ENCOUNTER — Other Ambulatory Visit: Payer: Self-pay | Admitting: Family Medicine

## 2014-12-18 ENCOUNTER — Telehealth: Payer: Self-pay | Admitting: Family Medicine

## 2014-12-18 MED ORDER — LISINOPRIL-HYDROCHLOROTHIAZIDE 20-12.5 MG PO TABS: 2.0000 | ORAL_TABLET | Freq: Every day | ORAL | Status: DC

## 2015-01-11 ENCOUNTER — Other Ambulatory Visit: Payer: Self-pay | Admitting: Family Medicine

## 2015-02-15 NOTE — Telephone Encounter (Signed)
Refill given

## 2015-04-19 ENCOUNTER — Other Ambulatory Visit: Payer: Self-pay | Admitting: Family Medicine

## 2015-06-18 ENCOUNTER — Other Ambulatory Visit: Payer: Self-pay | Admitting: Family Medicine

## 2016-02-29 ENCOUNTER — Ambulatory Visit (INDEPENDENT_AMBULATORY_CARE_PROVIDER_SITE_OTHER): Payer: BLUE CROSS/BLUE SHIELD | Admitting: *Deleted

## 2016-02-29 DIAGNOSIS — Z23 Encounter for immunization: Secondary | ICD-10-CM | POA: Diagnosis not present

## 2016-03-13 ENCOUNTER — Other Ambulatory Visit: Payer: Self-pay | Admitting: Family Medicine

## 2016-03-21 ENCOUNTER — Other Ambulatory Visit: Payer: Self-pay | Admitting: Family Medicine

## 2016-03-23 ENCOUNTER — Other Ambulatory Visit: Payer: Self-pay | Admitting: Family Medicine

## 2016-03-23 NOTE — Telephone Encounter (Signed)
Pt needs refills on lovastatin and lisinopril. Pt uses [facility]. ep

## 2016-03-24 MED ORDER — LOVASTATIN 20 MG PO TABS: 40.0000 mg | ORAL_TABLET | Freq: Every day | ORAL | 0 refills | Status: DC

## 2016-03-24 MED ORDER — LISINOPRIL-HYDROCHLOROTHIAZIDE 20-12.5 MG PO TABS: 2.0000 | ORAL_TABLET | Freq: Every day | ORAL | 0 refills | Status: DC

## 2016-03-24 NOTE — Telephone Encounter (Signed)
Office visit needed

## 2016-03-31 ENCOUNTER — Telehealth: Payer: Self-pay | Admitting: *Deleted

## 2016-03-31 NOTE — Telephone Encounter (Signed)
Pt came by inquiring about his lisinopril because it was a 15 day supply and he said it needed to be 30. After looking at the notes we told him that the PCP refilled enough to get him to his office visit probably and he said he just made the appointment today and that no one told him that he needed to have an office visit.  Told him that PCP would review meds at visit and determine if any changes should be done. Pt understood. Routing to PCP as FYI.  Adam SakaiZimmerman Rumple, [name], [place]

## 2016-03-31 NOTE — Telephone Encounter (Signed)
Pt has appointment on 04/10/16. Adam Caldwell, [name], [place]

## 2016-04-10 ENCOUNTER — Encounter: Payer: BLUE CROSS/BLUE SHIELD | Admitting: Family Medicine

## 2016-04-21 ENCOUNTER — Telehealth: Payer: Self-pay | Admitting: Family Medicine

## 2016-04-21 NOTE — Telephone Encounter (Signed)
Pt needs a refill on his BP medication called in. jw °

## 2016-04-27 ENCOUNTER — Ambulatory Visit (INDEPENDENT_AMBULATORY_CARE_PROVIDER_SITE_OTHER): Payer: BLUE CROSS/BLUE SHIELD | Admitting: Family Medicine

## 2016-04-27 ENCOUNTER — Encounter: Payer: Self-pay | Admitting: Family Medicine

## 2016-04-27 VITALS — BP 122/70 | HR 83 | Temp 98.4°F | Ht 70.0 in | Wt 285.2 lb

## 2016-04-27 DIAGNOSIS — E7800 Pure hypercholesterolemia, unspecified: Secondary | ICD-10-CM | POA: Diagnosis not present

## 2016-04-27 DIAGNOSIS — E785 Hyperlipidemia, unspecified: Secondary | ICD-10-CM

## 2016-04-27 DIAGNOSIS — Z1211 Encounter for screening for malignant neoplasm of colon: Secondary | ICD-10-CM

## 2016-04-27 DIAGNOSIS — Z1159 Encounter for screening for other viral diseases: Secondary | ICD-10-CM | POA: Diagnosis not present

## 2016-04-27 DIAGNOSIS — Z0000 Encounter for general adult medical examination without abnormal findings: Secondary | ICD-10-CM | POA: Diagnosis not present

## 2016-04-27 DIAGNOSIS — R69 Illness, unspecified: Secondary | ICD-10-CM

## 2016-04-27 DIAGNOSIS — I10 Essential (primary) hypertension: Secondary | ICD-10-CM | POA: Diagnosis not present

## 2016-04-27 DIAGNOSIS — Z114 Encounter for screening for human immunodeficiency virus [HIV]: Secondary | ICD-10-CM

## 2016-04-27 MED ORDER — LISINOPRIL-HYDROCHLOROTHIAZIDE 20-12.5 MG PO TABS: 2.0000 | ORAL_TABLET | Freq: Every day | ORAL | 3 refills | Status: DC

## 2016-04-27 MED ORDER — LOVASTATIN 40 MG PO TABS: 40.0000 mg | ORAL_TABLET | Freq: Every day | ORAL | 3 refills | Status: DC

## 2016-04-27 NOTE — Patient Instructions (Signed)
Thank you so much for coming to visit today! We will check several labs today. Your Lisinopril-HCTZ and Lovastatin were refilled today. If your stool cards are negative, we will refer you to GI for colonoscopy. Please return in one year.  Dr. Caroleen Hammanumley

## 2016-04-28 LAB — CMP14+EGFR
ALBUMIN: 4.4 g/dL (ref 3.5–5.5)
ALT: 22 IU/L (ref 0–44)
AST: 26 IU/L (ref 0–40)
Albumin/Globulin Ratio: 1.3 (ref 1.2–2.2)
Alkaline Phosphatase: 90 IU/L (ref 39–117)
BILIRUBIN TOTAL: 0.5 mg/dL (ref 0.0–1.2)
BUN / CREAT RATIO: 11 (ref 9–20)
BUN: 12 mg/dL (ref 6–24)
CALCIUM: 10 mg/dL (ref 8.7–10.2)
CHLORIDE: 97 mmol/L (ref 96–106)
CO2: 25 mmol/L (ref 18–29)
Creatinine, Ser: 1.07 mg/dL (ref 0.76–1.27)
GFR, EST AFRICAN AMERICAN: 87 mL/min/{1.73_m2} (ref >59)
GFR, EST NON AFRICAN AMERICAN: 76 mL/min/{1.73_m2} (ref >59)
GLOBULIN, TOTAL: 3.5 g/dL (ref 1.5–4.5)
Glucose: 98 mg/dL (ref 65–99)
POTASSIUM: 3.1 mmol/L — AB (ref 3.5–5.2)
SODIUM: 139 mmol/L (ref 134–144)
Total Protein: 7.9 g/dL (ref 6.0–8.5)

## 2016-04-28 LAB — HEPATITIS C ANTIBODY

## 2016-04-28 LAB — LIPID PANEL
CHOL/HDL RATIO: 2.9 ratio (ref 0.0–5.0)
CHOLESTEROL TOTAL: 147 mg/dL (ref 100–199)
HDL: 51 mg/dL (ref >39)
LDL Calculated: 80 mg/dL (ref 0–99)
Triglycerides: 79 mg/dL (ref 0–149)
VLDL Cholesterol Cal: 16 mg/dL (ref 5–40)

## 2016-04-28 LAB — CBC
HEMOGLOBIN: 14.1 g/dL (ref 13.0–17.7)
Hematocrit: 41.6 % (ref 37.5–51.0)
MCH: 27.4 pg (ref 26.6–33.0)
MCHC: 33.9 g/dL (ref 31.5–35.7)
MCV: 81 fL (ref 79–97)
Platelets: 181 10*3/uL (ref 150–379)
RBC: 5.14 x10E6/uL (ref 4.14–5.80)
RDW: 14.4 % (ref 12.3–15.4)
WBC: 6.3 10*3/uL (ref 3.4–10.8)

## 2016-04-28 LAB — HIV ANTIBODY (ROUTINE TESTING W REFLEX): HIV Screen 4th Generation wRfx: NONREACTIVE

## 2016-04-30 NOTE — Assessment & Plan Note (Signed)
Will check CBC, Hepatitis C, HIV today. Will also do FOBTx3, if abnormal will obtain colonoscopy. Return in 1 year.

## 2016-04-30 NOTE — Progress Notes (Signed)
Subjective:     Patient ID: Adam Caldwell, male   DOB: 14-Feb-1955, 61 y.o.   MRN: [id number]  HPI Adam Caldwell is a 61yo male presenting today for follow up of hypertension.  Currently well controlled with HCTZ-Lisinopril. Also on Lovastatin for hyperlipidemia. Requests refills of both medications. Would like labs today, including CBC, Lipid Panel, and CMP. Also has never had screening for HIV or Hepatitis C. Wishes to have FOBT x3 instead of colonoscopy, but agrees to go for colonoscopy if abnormal. Exercises by walking 30+ minutes daily. Nonsmoker.  Review of Systems Per HPI    Objective:   Physical Exam  Constitutional: He appears well-developed and well-nourished. No distress.  HENT:  Mouth/Throat: Oropharynx is clear and moist.  Cardiovascular: Normal rate and regular rhythm.   No murmur heard. Pulmonary/Chest: Effort normal. No respiratory distress. He has no wheezes.  Abdominal: Soft. He exhibits no distension.  Musculoskeletal: He exhibits no edema.  Skin: No rash noted.  Psychiatric: He has a normal mood and affect. His behavior is normal.      Assessment and Plan:     HYPERTENSION, BENIGN SYSTEMIC Well controlled. HCTZ-Lisinopril refilled. Will check CMP today.  HYPERCHOLESTEROLEMIA Lovastatin refilled. Will recheck Lipid Panel today.  Health care maintenance Will check CBC, Hepatitis C, HIV today. Will also do FOBTx3, if abnormal will obtain colonoscopy. Return in 1 year.

## 2016-04-30 NOTE — Assessment & Plan Note (Signed)
Well controlled. HCTZ-Lisinopril refilled. Will check CMP today.

## 2016-04-30 NOTE — Assessment & Plan Note (Signed)
Lovastatin refilled. Will recheck Lipid Panel today.

## 2016-05-01 ENCOUNTER — Telehealth: Payer: Self-pay | Admitting: Family Medicine

## 2016-05-01 NOTE — Telephone Encounter (Signed)
Patient came to office stated RX Mevacor is too expensive. Can something else be called in?  Patient ask if packet for stool test can be mailed to him. If any questions please call patient at [phone]

## 2016-05-02 ENCOUNTER — Encounter: Payer: Self-pay | Admitting: Family Medicine

## 2016-05-03 ENCOUNTER — Encounter: Payer: Self-pay | Admitting: Family Medicine

## 2016-05-04 MED ORDER — LOVASTATIN 40 MG PO TABS: 40.0000 mg | ORAL_TABLET | Freq: Every day | ORAL | 3 refills | Status: DC

## 2016-05-04 NOTE — Telephone Encounter (Signed)
Prescription for Lovastatin printed and left at front desk with coupon to [facility] for $10 for 90 days supply.

## 2016-05-04 NOTE — Telephone Encounter (Signed)
LVM for pt to call back to inform him of below. [name], [name], CMA  

## 2016-05-09 ENCOUNTER — Telehealth: Payer: Self-pay | Admitting: *Deleted

## 2016-05-09 NOTE — Addendum Note (Signed)
Addended by: Lamonte Sakai, [name] on: 05/09/2016 02:04 PM   Modules accepted: Orders

## 2016-05-09 NOTE — Telephone Encounter (Signed)
Pt came into office today to pick up the Rx that was placed up front and stated that doctor was supposed to leave stool cards for him.  Malachy Mood said the cards were not there so I looked into chart and saw where it was mentioned and also saw an order for this.  Today we started using the new [facility] stool collection kit and I tried to explain to him about this and how to use and he said he knew how to use them.  He voiced frustration of why it had taken so long and I apologized to him and he said that he would definitely be making a review and referenced something about 2 weeks. Pt left with stool collection kit and was told to be sure to fill it out completely and to return to the office. Katharina Caper, [name], [place]

## 2016-05-21 ENCOUNTER — Other Ambulatory Visit: Payer: Self-pay | Admitting: Family Medicine

## 2016-05-23 DIAGNOSIS — Z1211 Encounter for screening for malignant neoplasm of colon: Secondary | ICD-10-CM | POA: Diagnosis not present

## 2016-05-23 NOTE — Addendum Note (Signed)
Addended by: Jennette Bill on: 05/23/2016 04:23 PM   Modules accepted: Orders

## 2016-05-25 LAB — FECAL OCCULT BLOOD, IMMUNOCHEMICAL: FECAL OCCULT BLD: NEGATIVE

## 2017-05-28 ENCOUNTER — Other Ambulatory Visit: Payer: Self-pay

## 2017-05-29 MED ORDER — LOVASTATIN 40 MG PO TABS: 40.0000 mg | ORAL_TABLET | Freq: Every day | ORAL | 3 refills | Status: DC

## 2017-05-30 ENCOUNTER — Other Ambulatory Visit: Payer: Self-pay

## 2017-05-30 MED ORDER — LISINOPRIL-HYDROCHLOROTHIAZIDE 20-12.5 MG PO TABS: 2.0000 | ORAL_TABLET | Freq: Every day | ORAL | 3 refills | Status: DC

## 2017-06-11 ENCOUNTER — Ambulatory Visit (INDEPENDENT_AMBULATORY_CARE_PROVIDER_SITE_OTHER): Payer: BLUE CROSS/BLUE SHIELD | Admitting: Family Medicine

## 2017-06-11 ENCOUNTER — Other Ambulatory Visit: Payer: Self-pay

## 2017-06-11 ENCOUNTER — Encounter: Payer: Self-pay | Admitting: Family Medicine

## 2017-06-11 VITALS — BP 114/74 | HR 88 | Temp 98.6°F | Ht 70.0 in | Wt 277.0 lb

## 2017-06-11 DIAGNOSIS — R7303 Prediabetes: Secondary | ICD-10-CM

## 2017-06-11 DIAGNOSIS — E7800 Pure hypercholesterolemia, unspecified: Secondary | ICD-10-CM

## 2017-06-11 DIAGNOSIS — Z1211 Encounter for screening for malignant neoplasm of colon: Secondary | ICD-10-CM

## 2017-06-11 DIAGNOSIS — R808 Other proteinuria: Secondary | ICD-10-CM

## 2017-06-11 DIAGNOSIS — R69 Illness, unspecified: Secondary | ICD-10-CM

## 2017-06-11 DIAGNOSIS — E119 Type 2 diabetes mellitus without complications: Secondary | ICD-10-CM | POA: Insufficient documentation

## 2017-06-11 DIAGNOSIS — Z0000 Encounter for general adult medical examination without abnormal findings: Secondary | ICD-10-CM | POA: Diagnosis not present

## 2017-06-11 HISTORY — DX: Illness, unspecified: R69

## 2017-06-11 LAB — POCT URINALYSIS DIP (MANUAL ENTRY)
BILIRUBIN UA: NEGATIVE
BILIRUBIN UA: NEGATIVE mg/dL
Blood, UA: NEGATIVE
GLUCOSE UA: NEGATIVE mg/dL
LEUKOCYTES UA: NEGATIVE
NITRITE UA: NEGATIVE
Protein Ur, POC: NEGATIVE mg/dL
Spec Grav, UA: 1.015 (ref 1.010–1.025)
Urobilinogen, UA: 0.2 E.U./dL
pH, UA: 6 (ref 5.0–8.0)

## 2017-06-11 LAB — POCT GLYCOSYLATED HEMOGLOBIN (HGB A1C): Hemoglobin A1C: 6.6

## 2017-06-11 MED ORDER — ATORVASTATIN CALCIUM 40 MG PO TABS: 40.0000 mg | ORAL_TABLET | Freq: Every day | ORAL | 3 refills | Status: DC

## 2017-06-11 MED ORDER — METFORMIN HCL 500 MG PO TABS: 500.0000 mg | ORAL_TABLET | Freq: Two times a day (BID) | ORAL | 3 refills | Status: DC

## 2017-06-11 NOTE — Telephone Encounter (Signed)
Requesting refill of lipitor and metformin to [facility] [address] Call back [phone] Shawna Orleans, RN

## 2017-06-11 NOTE — Assessment & Plan Note (Addendum)
Patient continues to take lovastatin 40 mg daily.  ASCVD risk is 20% in the next 10 years, so will switch from lovastatin to atorvastatin 40 mg daily.  Will obtain lipid panel today.

## 2017-06-11 NOTE — Assessment & Plan Note (Addendum)
A1c today is 6.6, so patient now has Type 2 Diabetes.  Counseled patient on diet changes.  Switched patient to high intensity statin and started metformin 500 daily for one week then 500 mg BID.  Advised patient that he will need to continue getting yearly eye exams.  Will need to recheck A1c in 3 months.

## 2017-06-11 NOTE — Assessment & Plan Note (Signed)
Patient reports history of proteinuria and requests urinalysis today.

## 2017-06-11 NOTE — Patient Instructions (Addendum)
It was nice meeting you today Adam Caldwell!  Today, we discussed your new diagnosis of diabetes.  We will add metformin and change your cholesterol medication to Lipitor.  Please call our clinic if these medications are too expensive at [facility].  Please take one pill per day of metformin for the first week then take one pill in the morning and one in the evening.  This is different from the bottle instructions, but we do this to prevent diarrhea.  Please discontinue the lovastatin and take Lipitor instead for your cholesterol.  I am referring you for a colonoscopy today.  Please call [facility] for an appointment.  We are obtaining labs to check your cholesterol, urine, and electrolytes today.  We will call you if there are abnormal results.  I would like to see you again in three months for a check up on your diabetes.   Congratulations to your son on his graduation!  If you have any questions or concerns, please feel free to call the clinic.   Be well,  Dr. Shan Levans  Type 2 Diabetes Mellitus, Diagnosis, Adult Type 2 diabetes (type 2 diabetes mellitus) is a long-term (chronic) disease. In type 2 diabetes, one or both of these problems may be present:  The pancreas does not make enough of a hormone called insulin.  Cells in the body do not respond properly to insulin that the body makes (insulin resistance).  Normally, insulin allows blood sugar (glucose) to enter cells in the body. The cells use glucose for energy. Insulin resistance or lack of insulin causes excess glucose to build up in the blood instead of going into cells. As a result, high blood glucose (hyperglycemia) develops. What increases the risk? The following factors may make you more likely to develop type 2 diabetes:  Having a family member with type 2 diabetes.  Being overweight or obese.  Having an inactive (sedentary) lifestyle.  Having been diagnosed with insulin resistance.  Having a history of prediabetes,  gestational diabetes, or polycystic ovarian syndrome (PCOS).  Being of American-Indian, African-American, Hispanic/Latino, or Asian/Pacific Islander descent.  What are the signs or symptoms? In the early stage of this condition, you may not have symptoms. Symptoms develop slowly and may include:  Increased thirst (polydipsia).  Increased hunger(polyphagia).  Increased urination (polyuria).  Increased urination during the night (nocturia).  Unexplained weight loss.  Frequent infections that keep coming back (recurring).  Fatigue.  Weakness.  Vision changes, such as blurry vision.  Cuts or bruises that are slow to heal.  Tingling or numbness in the hands or feet.  Dark patches on the skin (acanthosis nigricans).  How is this diagnosed?  This condition is diagnosed based on your symptoms, your medical history, a physical exam, and your blood glucose level. Your blood glucose may be checked with one or more of the following blood tests:  A fasting blood glucose (FBG) test. You will not be allowed to eat (you will fast) for at least 8 hours before a blood sample is taken.  A random blood glucose test. This checks blood glucose at any time of day regardless of when you ate.  An A1c (hemoglobin A1c) blood test. This provides information about blood glucose control over the previous 2-3 months.  An oral glucose tolerance test (OGTT). This measures your blood glucose at two times: ? After fasting. This is your baseline blood glucose level. ? Two hours after drinking a beverage that contains glucose.  You may be diagnosed with  type 2 diabetes if:  Your FBG level is 126 mg/dL (7.0 mmol/L) or higher.  Your random blood glucose level is 200 mg/dL (11.1 mmol/L) or higher.  Your A1c level is 6.5% or higher.  Your OGGT result is higher than 200 mg/dL (11.1 mmol/L).  These blood tests may be repeated to confirm your diagnosis. How is this treated?  Your treatment may be  managed by a specialist called an endocrinologist. Type 2 diabetes may be treated by following instructions from your health care provider about:  Making diet and lifestyle changes. This may include: ? Following an individualized nutrition plan that is developed by a diet and nutrition specialist (registered dietitian). ? Exercising regularly. ? Finding ways to manage stress.  Checking your blood glucose level as often as directed.  Taking diabetes medicines or insulin daily. This helps to keep your blood glucose levels in the healthy range. ? If you use insulin, you may need to adjust the dosage depending on how physically active you are and what foods you eat. Your health care provider will tell you how to adjust your dosage.  Taking medicines to help prevent complications from diabetes, such as: ? Aspirin. ? Medicine to lower cholesterol. ? Medicine to control blood pressure.  Your health care provider will set individualized treatment goals for you. Your goals will be based on your age, other medical conditions you have, and how you respond to diabetes treatment. Generally, the goal of treatment is to maintain the following blood glucose levels:  Before meals (preprandial): 80-130 mg/dL (4.4-7.2 mmol/L).  After meals (postprandial): below 180 mg/dL (10 mmol/L).  A1c level: less than 7%.  Follow these instructions at home: Questions to [place] Provider Consider asking the following questions:  Do I need to meet with a diabetes educator?  Where can I find a support group for people with diabetes?  What equipment will I need to manage my diabetes at home?  What diabetes medicines do I need, and when should I take them?  How often do I need to check my blood glucose?  What number can I call if I have questions?  When is my next appointment?  General instructions  Take over-the-counter and prescription medicines only as told by your health care  provider.  Keep all follow-up visits as told by your health care provider. This is important.  For more information about diabetes, visit: ? American Diabetes Association (ADA): www.diabetes.org ? American Association of Diabetes Educators (AADE): www.diabeteseducator.org/patient-resources Contact a health care provider if:  Your blood glucose is at or above 240 mg/dL (13.3 mmol/L) for 2 days in a row.  You have been sick or have had a fever for 2 days or longer and you are not getting better.  You have any of the following problems for more than 6 hours: ? You cannot eat or drink. ? You have nausea and vomiting. ? You have diarrhea. Get help right away if:  Your blood glucose is lower than 54 mg/dL (3.0 mmol/L).  You become confused or you have trouble thinking clearly.  You have difficulty breathing.  You have moderate or large ketone levels in your urine. This information is not intended to replace advice given to you by your health care provider. Make sure you discuss any questions you have with your health care provider. Document Released: 01/16/2005 Document Revised: 06/24/2015 Document Reviewed: 02/19/2015 Elsevier Interactive Patient Education  2018 [employer].  Type 2 Diabetes Mellitus, Self Care, Adult Caring for  yourself after you have been diagnosed with type 2 diabetes (type 2 diabetes mellitus) means keeping your blood sugar (glucose) under control with a balance of:  Nutrition.  Exercise.  Lifestyle changes.  Medicines or insulin, if necessary.  Support from your team of health care providers and others.  The following information explains what you need to know to manage your diabetes at home. What do I need to do to manage my blood glucose?  Check your blood glucose every day, as often as told by your health care provider.  Contact your health care provider if your blood glucose is above your target for 2 tests in a row.  Have your A1c (hemoglobin  A1c) level checked at least two times a year, or as often as told by your health care provider. Your health care provider will set individualized treatment goals for you. Generally, the goal of treatment is to maintain the following blood glucose levels:  Before meals (preprandial): 80-130 mg/dL (4.4-7.2 mmol/L).  After meals (postprandial): below 180 mg/dL (10 mmol/L).  A1c level: less than 7%.  What do I need to know about hyperglycemia and hypoglycemia? What is hyperglycemia? Hyperglycemia, also called high blood glucose, occurs when blood glucose is too high.Make sure you know the early signs of hyperglycemia, such as:  Increased thirst.  Hunger.  Feeling very tired.  Needing to urinate more often than usual.  Blurry vision.  What is hypoglycemia? Hypoglycemia, also called low blood glucose, occurswith a blood glucose level at or below 70 mg/dL (3.9 mmol/L). The risk for hypoglycemia increases during or after exercise, during sleep, during illness, and when skipping meals or not eating for a long time (fasting). It is important to know the symptoms of hypoglycemia and treat it right away. Always have a 15-gram rapid-acting carbohydrate snack with you to treat low blood glucose. Family members and close friends should also know the symptoms and should understand how to treat hypoglycemia, in case you are not able to treat yourself. What are the symptoms of hypoglycemia? Hypoglycemia symptoms can include:  Hunger.  Anxiety.  Sweating and feeling clammy.  Confusion.  Dizziness or feeling light-headed.  Sleepiness.  Nausea.  Increased heart rate.  Headache.  Blurry vision.  Seizure.  Nightmares.  Tingling or numbness around the mouth, lips, or tongue.  A change in speech.  Decreased ability to concentrate.  A change in coordination.  Restless sleep.  Tremors or shakes.  Fainting.  Irritability.  How do I treat hypoglycemia?  If you are alert  and able to swallow safely, follow the 15:15 rule:  Take 15 grams of a rapid-acting carbohydrate. Rapid-acting options include: ? 1 tube of glucose gel. ? 3 glucose pills. ? 6-8 pieces of hard candy. ? 4 oz (120 mL) of fruit juice. ? 4 oz (120 mL) of regular (not diet) soda.  Check your blood glucose 15 minutes after you take the carbohydrate.  If the repeat blood glucose level is still at or below 70 mg/dL (3.9 mmol/L), take 15 grams of a carbohydrate again.  If your blood glucose level does not increase above 70 mg/dL (3.9 mmol/L) after 3 tries, seek emergency medical care.  After your blood glucose level returns to normal, eat a meal or a snack within 1 hour.  How do I treat severe hypoglycemia? Severe hypoglycemia is when your blood glucose level is at or below 54 mg/dL (3 mmol/L). Severe hypoglycemia is an emergency. Do not wait to see if the symptoms will go  away. Get medical help right away. Call your local emergency services (911 in the [place] Do not drive yourself to the hospital. If you have severe hypoglycemia and you cannot eat or drink, you may need an injection of glucagon. A family member or close friend should learn how to check your blood glucose and how to give you a glucagon injection. Ask your health care provider if you need to have an emergency glucagon injection kit available. Severe hypoglycemia may need to be treated in a hospital. The treatment may include getting glucose through an IV tube. You may also need treatment for the cause of your hypoglycemia. Can having diabetes put me at risk for other conditions? Having diabetes can put you at risk for other long-term (chronic) conditions, such as heart disease and kidney disease. Your health care provider may prescribe medicines to help prevent complications from diabetes. These medicines may include:  Aspirin.  Medicine to lower cholesterol.  Medicine to control blood pressure.  What else can I do to manage my  diabetes? Take your diabetes medicines as told  If your health care provider prescribed insulin or diabetes medicines, take them every day.  Do not run out of insulin or other diabetes medicines that you take. Plan ahead so you always have these available.  If you use insulin, adjust your dosage based on how physically active you are and what foods you eat. Your health care provider will tell you how to adjust your dosage. Make healthy food choices  The things that you eat and drink affect your blood glucose and your insulin dosage. Making good choices helps to control your diabetes and prevent other health problems. A healthy meal plan includes eating lean proteins, complex carbohydrates, fresh fruits and vegetables, low-fat dairy products, and healthy fats. Make an appointment to see a diet and nutrition specialist (registered dietitian) to help you create an eating plan that is right for you. Make sure that you:  Follow instructions from your health care provider about eating or drinking restrictions.  Drink enough fluid to keep your urine clear or pale yellow.  Eat healthy snacks between nutritious meals.  Track the carbohydrates that you eat. Do this by reading food labels and learning the standard serving sizes of foods.  Follow your sick day plan whenever you cannot eat or drink as usual. Make this plan in advance with your health care provider.  Stay active  Exercise regularly, as told by your health care provider. This may include:  Stretching and doing strength exercises, such as yoga or weightlifting, at least 2 times a week.  Doing at least 150 minutes of moderate-intensity or vigorous-intensity exercise each week. This could be brisk walking, biking, or water aerobics. ? Spread out your activity over at least 3 days of the week. ? Do not go more than 2 days in a row without doing some kind of physical activity.  When you start a new exercise or activity, work with your  health care provider to adjust your insulin, medicines, or food intake as needed. Make healthy lifestyle choices  Do not use any tobacco products, such as cigarettes, chewing tobacco, and e-cigarettes. If you need help quitting, ask your health care provider.  If your health care provider says that alcohol is safe for you, limit alcohol intake to no more than 1 drink per day for nonpregnant women and 2 drinks per day for men. One drink equals 12 oz of beer, 5 oz of wine, or 1 oz  of hard liquor.  Learn to manage stress. If you need help with this, ask your health care provider. Care for your body   Keep your immunizations up to date. In addition to getting vaccinations as told by your health care provider, it is recommended that you get vaccinated against the following illnesses: ? The flu (influenza). Get a flu shot every year. ? Pneumonia. ? Hepatitis B.  Schedule an eye exam soon after your diagnosis, and then one time every year after that.  Check your skin and feet every day for cuts, bruises, redness, blisters, or sores. Schedule a foot exam with your health care provider once every year.  Brush your teeth and gums two times a day, and floss at least one time a day. Visit your dentist at least once every 6 months.  Maintain a healthy weight. General instructions  Take over-the-counter and prescription medicines only as told by your health care provider.  Share your diabetes management plan with people in your workplace, school, and household.  Check your urine for ketones when you are ill and as told by your health care provider.  Ask your health care provider: ? Do I need to meet with a diabetes educator? ? Where can I find a support group for people with diabetes?  Carry a medical alert card or wear medical alert jewelry.  Keep all follow-up visits as told by your health care provider. This is important. Where to find more information: For more information about  diabetes, visit:  American Diabetes Association (ADA): www.diabetes.org  American Association of Diabetes Educators (AADE): www.diabeteseducator.org/patient-resources  This information is not intended to replace advice given to you by your health care provider. Make sure you discuss any questions you have with your health care provider. Document Released: 05/10/2015 Document Revised: 06/24/2015 Document Reviewed: 02/19/2015 Elsevier Interactive Patient Education  [employer].

## 2017-06-11 NOTE — Assessment & Plan Note (Signed)
Will obtain BMP today given diuretic use.

## 2017-06-11 NOTE — Assessment & Plan Note (Signed)
Referred patient for colonoscopy today. 

## 2017-06-11 NOTE — Progress Notes (Signed)
   Subjective:    Adam Caldwell - 62 y.o. male MRN [id number]  Date of birth: February 24, 1955  HPI  Adam Caldwell is here for his yearly physical.  He has no concerns.  He continues to take his medications as prescribed.  He denies vision changes or numbness or tingling of his extremities.  He sees an eye doctor each year and has had floaters but no signs of retinopathy.  He thinks it has been about 10 years since he got his last colonoscopy and is willing to get another colonoscopy this year.  Health Maintenance:  Health Maintenance Due  Topic Date Due  . PNEUMOCOCCAL POLYSACCHARIDE VACCINE (1) 09/27/1958  . FOOT EXAM  09/27/1966  . OPHTHALMOLOGY EXAM  09/27/1966  . COLONOSCOPY  09/27/2006  . HEMOGLOBIN A1C  04/20/2014    -  reports that he has never smoked. He has never used smokeless tobacco. - Review of Systems: Per HPI. - Past Medical History: Patient Active Problem List   Diagnosis Date Noted  . Taking multiple medications for chronic disease 06/11/2017  . Type 2 diabetes mellitus (HCC) 06/11/2017  . Health care maintenance 12/03/2014  . Plantar fasciitis of right foot 12/03/2014  . Other proteinuria 09/12/2006  . HYPERCHOLESTEROLEMIA 03/29/2006  . OBESITY, NOS 03/29/2006  . HYPERTENSION, BENIGN SYSTEMIC 03/29/2006   - Medications: reviewed and updated   Objective:   Physical Exam BP 114/74 (BP Location: Left Arm, Patient Position: Sitting, Cuff Size: Large)   Pulse 88   Temp 98.6 F (37 C) (Oral)   Ht  (1.778 m)   Wt 277 lb (125.6 kg)   SpO2 97%   BMI 39.75 kg/m  Gen: NAD, alert, cooperative with exam, well-appearing HEENT: NCAT, clear conjunctiva,  supple neck CV: RRR, good S1/S2, no murmur, no edema Resp: CTABL, no wheezes, non-labored Abd: SNTND, BS present, no guarding or organomegaly Skin: no rashes, normal turgor  Neuro: no gross deficits.  Psych: good insight, alert and oriented  Diabetic Foot Exam - Simple   Simple Foot Form Diabetic Foot exam  was performed with the following findings:  Yes 06/11/2017 10:11 AM  Visual Inspection No deformities, no ulcerations, no other skin breakdown bilaterally:  Yes Sensation Testing Intact to touch and monofilament testing bilaterally:  Yes Pulse Check Posterior Tibialis and Dorsalis pulse intact bilaterally:  Yes Comments          Assessment & Plan:   HYPERCHOLESTEROLEMIA Patient continues to take lovastatin 40 mg daily.  ASCVD risk is 20% in the next 10 years, so will switch from lovastatin to atorvastatin 40 mg daily.  Will obtain lipid panel today.  Taking multiple medications for chronic disease Will obtain BMP today given diuretic use.  Type 2 diabetes mellitus (HCC) A1c today is 6.6, so patient now has Type 2 Diabetes.  Counseled patient on diet changes.  Switched patient to high intensity statin and started metformin 500 daily for one week then 500 mg BID.  Advised patient that he will need to continue getting yearly eye exams.  Will need to recheck A1c in 3 months.  Other proteinuria Patient reports history of proteinuria and requests urinalysis today.  Health care maintenance Referred patient for colonoscopy today.    Lezlie Octave, M.D. 06/11/2017, 1:18 PM PGY-1, [facility]

## 2017-06-12 LAB — BASIC METABOLIC PANEL
BUN / CREAT RATIO: 12 (ref 10–24)
BUN: 13 mg/dL (ref 8–27)
CHLORIDE: 101 mmol/L (ref 96–106)
CO2: 25 mmol/L (ref 20–29)
CREATININE: 1.05 mg/dL (ref 0.76–1.27)
Calcium: 9.6 mg/dL (ref 8.6–10.2)
GFR calc Af Amer: 89 mL/min/{1.73_m2} (ref >59)
GFR calc non Af Amer: 77 mL/min/{1.73_m2} (ref >59)
GLUCOSE: 124 mg/dL — AB (ref 65–99)
Potassium: 4 mmol/L (ref 3.5–5.2)
SODIUM: 139 mmol/L (ref 134–144)

## 2017-06-12 LAB — LIPID PANEL
CHOL/HDL RATIO: 2.7 ratio (ref 0.0–5.0)
Cholesterol, Total: 126 mg/dL (ref 100–199)
HDL: 47 mg/dL (ref >39)
LDL Calculated: 67 mg/dL (ref 0–99)
Triglycerides: 58 mg/dL (ref 0–149)
VLDL Cholesterol Cal: 12 mg/dL (ref 5–40)

## 2017-06-12 MED ORDER — METFORMIN HCL 500 MG PO TABS: 500.0000 mg | ORAL_TABLET | Freq: Two times a day (BID) | ORAL | 3 refills | Status: DC

## 2017-06-12 MED ORDER — ATORVASTATIN CALCIUM 40 MG PO TABS: 40.0000 mg | ORAL_TABLET | Freq: Every day | ORAL | 3 refills | Status: DC

## 2017-06-13 ENCOUNTER — Other Ambulatory Visit: Payer: Self-pay | Admitting: Family Medicine

## 2017-06-13 ENCOUNTER — Telehealth: Payer: Self-pay

## 2017-06-13 MED ORDER — ATORVASTATIN CALCIUM 40 MG PO TABS: 40.0000 mg | ORAL_TABLET | Freq: Every day | ORAL | 3 refills | Status: DC

## 2017-06-13 NOTE — Telephone Encounter (Signed)
-----   Message from Lennox Solders, MD sent at 06/12/2017  9:02 AM EDT ----- Results from his lipid panel and basic metabolic panel are normal other than an elevated glucose, which is consistent with his previously diagnosed diabetes.  We will not make any changes to his medications.

## 2017-06-13 NOTE — Telephone Encounter (Signed)
Called patient back to let him know that I do want him to take the Lipitor as we discussed at his clinic visit.  Sent the prescription to [facility] instead at his request since it was too expensive at [facility].

## 2017-06-13 NOTE — Telephone Encounter (Signed)
Pt is confused. If you don't want to make changes to his medication should he start taking the Lipitor you prescribed?  If everything is normal, does he need the Lipitor?  Patient would like to talk to Dr.Frances Furbishfrey. Adam Caldwell, CMA

## 2017-08-17 ENCOUNTER — Encounter: Payer: Self-pay | Admitting: Family Medicine

## 2017-09-18 ENCOUNTER — Encounter: Payer: Self-pay | Admitting: Family Medicine

## 2017-09-18 ENCOUNTER — Ambulatory Visit (INDEPENDENT_AMBULATORY_CARE_PROVIDER_SITE_OTHER): Payer: BLUE CROSS/BLUE SHIELD | Admitting: Family Medicine

## 2017-09-18 ENCOUNTER — Other Ambulatory Visit: Payer: Self-pay

## 2017-09-18 VITALS — BP 108/62 | HR 84 | Temp 98.5°F | Ht 70.0 in | Wt 275.6 lb

## 2017-09-18 DIAGNOSIS — E119 Type 2 diabetes mellitus without complications: Secondary | ICD-10-CM

## 2017-09-18 DIAGNOSIS — Z0000 Encounter for general adult medical examination without abnormal findings: Secondary | ICD-10-CM

## 2017-09-18 DIAGNOSIS — R7303 Prediabetes: Secondary | ICD-10-CM

## 2017-09-18 DIAGNOSIS — I10 Essential (primary) hypertension: Secondary | ICD-10-CM | POA: Diagnosis not present

## 2017-09-18 LAB — POCT GLYCOSYLATED HEMOGLOBIN (HGB A1C): HbA1c, POC (controlled diabetic range): 6.6 % (ref 0.0–7.0)

## 2017-09-18 MED ORDER — METFORMIN HCL 500 MG PO TABS: 1000.0000 mg | ORAL_TABLET | Freq: Two times a day (BID) | ORAL | 3 refills | Status: DC

## 2017-09-18 NOTE — Patient Instructions (Signed)
It was nice seeing you today Adam Caldwell!  Today, we will increase your metformin dose to 1,000 mg twice daily.  Please start with 500 mg in the morning and 1,000 at night and increase to 1,000 twice daily when your symptoms are tolerable.  Call the GI office when you're ready for your colonoscopy.  I would like to see you back in three months to check your A1c.  If you have any questions or concerns, please feel free to call the clinic.   Be well,  Dr. Frances FurbishWinfrey
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
## 2017-09-19 NOTE — Assessment & Plan Note (Signed)
Encourage patient to schedule a colonoscopy when ready.  I placed the order at his last visit.

## 2017-09-19 NOTE — Assessment & Plan Note (Addendum)
Will increase metformin dose from 500 mg twice daily to 1000 mg twice daily.  Will taper up to this dose after patient tolerates 500 mg in the morning and 1000 mg in the evening.  Reviewed patient's last lipid panel with him, which shows that his lipids are well controlled.  Explained that a high intensity statin is still indicated due to his diabetes and that the lower we get his LDL, the lower his risk of heart attack and stroke will be.  Would like to see patient back in 3 months for diabetes follow-up.

## 2017-09-19 NOTE — Assessment & Plan Note (Signed)
Blood pressure well controlled today.  Patient may benefit from switching from lisinopril-HCTZ to lisinopril alone given metabolic side effects of HCTZ.  Patient elected to make this change at his next visit.

## 2017-09-20 ENCOUNTER — Ambulatory Visit (INDEPENDENT_AMBULATORY_CARE_PROVIDER_SITE_OTHER): Payer: BLUE CROSS/BLUE SHIELD

## 2017-09-20 DIAGNOSIS — Z23 Encounter for immunization: Secondary | ICD-10-CM | POA: Diagnosis not present

## 2017-09-20 DIAGNOSIS — E119 Type 2 diabetes mellitus without complications: Secondary | ICD-10-CM | POA: Diagnosis not present

## 2017-09-20 MED ORDER — METFORMIN HCL 500 MG PO TABS: 1000.0000 mg | ORAL_TABLET | Freq: Two times a day (BID) | ORAL | 3 refills | Status: DC

## 2017-09-20 NOTE — Progress Notes (Signed)
   Patient in to nurse clinic for Pneumovax. Will wait for flu vaccine until next OV. Pneumovax given right deltoid. Patient tolerated well. [facility] updated. Ples SpecterAlisa Brake, RN [facility] RN)

## 2017-09-27 ENCOUNTER — Ambulatory Visit: Payer: BLUE CROSS/BLUE SHIELD

## 2017-09-28 ENCOUNTER — Telehealth: Payer: Self-pay | Admitting: Family Medicine

## 2017-09-28 NOTE — Telephone Encounter (Signed)
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  Verified that patient section of form has been completed.  Last DOS/WCC with PCP was 09/18/2017.  Placed form in team folder to be completed by clinical staff.  Adam Caldwell

## 2017-10-02 NOTE — Telephone Encounter (Signed)
Clinical info completed on Medical form.  Place form in Dr. Starr Sinclair box for completion.  Lamonte Sakai, Ipek Westra D, [place]

## 2017-10-05 NOTE — Telephone Encounter (Signed)
A question on patient's employment form asks about TB shot or recent PPD.  I called the patient, who does not think that he's had a PPD done in the past year and doesn't know if he's had a TB shot (which would have been given in another country if he has had it).  So, we will need to schedule a time for him to get a PPD placed and call him when that is scheduled.  Then, I'll be able to finish his form.  Thanks!

## 2017-10-22 ENCOUNTER — Telehealth: Payer: Self-pay

## 2017-10-22 NOTE — Telephone Encounter (Signed)
-----   Message from Lennox SoldersAmanda C Winfrey, MD sent at 10/19/2017  3:08 PM EDT ----- Regarding: Schedule PPD for patient's employment form Adam Caldwell needs an appointment to get a PPD placed so that it will be up to date for his employment form.  If someone would schedule this and let him know when it is, I would appreciate it.  Thanks!

## 2017-10-22 NOTE — Telephone Encounter (Signed)
LVM for pt to call the office. If pt calls, please schedule a nurse visit for PPD (TB skin) test as per Dr. Starr SinclairWinfrey's note below. Sunday SpillersSharon T Kimi Bordeau, CMA

## 2017-10-25 NOTE — Telephone Encounter (Signed)
Pt called nurse line returning sharis phone call. I informed pt we do not have any nurse visits opened until 10/4. Pt was very rude and unappreciative. I offered for him to call back each day to see if there is a cancellation or if he could be worked in. I was not going going to [employer] the nurse schedule as we are short staffed. Alisa in agreement. Pt will keep the 10/4 for now and call each day.

## 2017-10-26 NOTE — Telephone Encounter (Signed)
Pt called again about TB- ok per Shanda Bumps to put patient on nurse schedule for 9/30. Pt has been made aware of apt.

## 2017-10-29 ENCOUNTER — Other Ambulatory Visit: Payer: Self-pay | Admitting: Urology

## 2017-10-29 ENCOUNTER — Ambulatory Visit (INDEPENDENT_AMBULATORY_CARE_PROVIDER_SITE_OTHER): Payer: BLUE CROSS/BLUE SHIELD | Admitting: *Deleted

## 2017-10-29 DIAGNOSIS — Z111 Encounter for screening for respiratory tuberculosis: Secondary | ICD-10-CM

## 2017-10-29 NOTE — Progress Notes (Signed)
Pt is here for a TB test today.  He is unsure if he has had TB vaccine in his "home country"    Patient is here for a PPD placement.  PPD placed in left forearm.  Patient will return 10/31/17 to have PPD read. Adam Caldwell, Maryjo Rochester, CMA

## 2017-10-30 LAB — PSA: PROSTATE SPECIFIC AG, SERUM: 0.3 ng/mL (ref 0.0–4.0)

## 2017-10-31 ENCOUNTER — Ambulatory Visit: Payer: BLUE CROSS/BLUE SHIELD

## 2017-10-31 ENCOUNTER — Ambulatory Visit
Admission: RE | Admit: 2017-10-31 | Discharge: 2017-10-31 | Disposition: A | Discharge status: Home / Self Care | Payer: BLUE CROSS/BLUE SHIELD | Source: Ambulatory Visit | Attending: Family Medicine | Admitting: Family Medicine

## 2017-10-31 ENCOUNTER — Other Ambulatory Visit: Payer: Self-pay | Admitting: Family Medicine

## 2017-10-31 DIAGNOSIS — R7611 Nonspecific reaction to tuberculin skin test without active tuberculosis: Secondary | ICD-10-CM

## 2017-10-31 LAB — TB SKIN TEST
Induration: 2 mm
TB Skin Test: POSITIVE

## 2017-10-31 NOTE — Assessment & Plan Note (Signed)
Needs CXR for employment.

## 2017-10-31 NOTE — Progress Notes (Signed)
+   PPD.  Patient likely had BCG vaccine an infant.  Order CXR for employment to make sure no active TB

## 2017-10-31 NOTE — Progress Notes (Signed)
Pt presents in nurse clinic for PPD skin read.   Interpretation: positive Induration: ~75mm   Most likely a positive due to receiving the TB vaccine in [place], Pt was unsure to begin with and was adamant about  getting a PPD done.   Precepted with Dr. Leveda Anna and a chest xray was ordered.   Pt given directions and phone number. Once we have negative imagining will sign off on his work form.   Form placed back in PCP box.

## 2017-11-01 NOTE — Progress Notes (Signed)
Thank you Page!  I will sign and send his form in tomorrow.

## 2017-11-02 ENCOUNTER — Ambulatory Visit: Payer: BLUE CROSS/BLUE SHIELD

## 2017-11-02 NOTE — Telephone Encounter (Signed)
Form completed and placed in RN folder. °

## 2017-11-02 NOTE — Telephone Encounter (Signed)
Patient was sent for chest xray for positive PPD skin test. Has had xray. Needs his form for employment completed today.  Call back is [phone]  Ples Specter, RN [facility] RN)

## 2017-11-05 ENCOUNTER — Telehealth: Payer: Self-pay | Admitting: Family Medicine

## 2017-11-05 NOTE — Telephone Encounter (Signed)
Pt informed form is ready for pick up.  

## 2017-11-05 NOTE — Telephone Encounter (Signed)
Health Examination Certificate  [employer] form dropped off for at front desk for completion.  Verified that patient section of form has been completed.  Last DOS/WCC with PCP was 06/11/17.  Placed form in [place] team folder to be completed by clinical staff.  Adam Caldwell

## 2017-11-05 NOTE — Telephone Encounter (Signed)
Clinical info completed on Health Exam Certificate form.  Place form in Dr. Starr Sinclair box for completion.  Lamonte Sakai, [name], [place]

## 2017-11-08 NOTE — Telephone Encounter (Signed)
Form completed and placed in nursing folder. 

## 2017-11-09 NOTE — Telephone Encounter (Signed)
Pt called and informed that the form is up front for pick up. Sunday Spillers, CMA

## 2018-01-14 ENCOUNTER — Telehealth: Payer: Self-pay | Admitting: *Deleted

## 2018-01-14 NOTE — Telephone Encounter (Signed)
Pt lm stating that someone called him.  No notes in chart or upcoming appts.    Attempted to call back, no answer and no machine. Nacole Fluhr, Maryjo RochesterJessica Dawn, CMA

## 2018-01-15 ENCOUNTER — Ambulatory Visit (INDEPENDENT_AMBULATORY_CARE_PROVIDER_SITE_OTHER): Payer: BLUE CROSS/BLUE SHIELD

## 2018-01-15 DIAGNOSIS — Z23 Encounter for immunization: Secondary | ICD-10-CM

## 2018-02-01 ENCOUNTER — Encounter: Payer: Self-pay | Admitting: Family Medicine

## 2018-02-01 ENCOUNTER — Other Ambulatory Visit: Payer: Self-pay

## 2018-02-01 ENCOUNTER — Ambulatory Visit (INDEPENDENT_AMBULATORY_CARE_PROVIDER_SITE_OTHER): Payer: BLUE CROSS/BLUE SHIELD | Admitting: Family Medicine

## 2018-02-01 VITALS — BP 120/70 | HR 76 | Temp 98.8°F | Wt 284.1 lb

## 2018-02-01 DIAGNOSIS — E119 Type 2 diabetes mellitus without complications: Secondary | ICD-10-CM | POA: Diagnosis not present

## 2018-02-01 LAB — POCT GLYCOSYLATED HEMOGLOBIN (HGB A1C): HbA1c, POC (controlled diabetic range): 6.7 % (ref 0.0–7.0)

## 2018-02-01 NOTE — Patient Instructions (Addendum)
It was nice seeing you today Mr. Adam Caldwell!  Your diabetes is well controlled with an A1c of 6.7.  Please continue taking metformin 1000 mg twice per day and work on reducing the amount of sugar you add to your beverages.  Please continue walking multiple times per week and try to increase this to every day if possible.  Please continue taking your other medications as prescribed  I would like to see you back in about 3 months to check up on your diabetes.  If you have any questions or concerns, please feel free to call the clinic.   Be well,  Dr. Frances Furbish

## 2018-02-01 NOTE — Assessment & Plan Note (Addendum)
A1c is 6.7 today, only slightly elevated from 6.6 four months ago.  Will continue metformin 1000 mg twice daily and counseled patient on reducing daily sugar intake and increasing his physical activity.  Patient was amenable to these changes and will try to reduce the amount of sugar he adds to his coffee and tea as his first goal.  We will plan to see again in 3 months.

## 2018-02-01 NOTE — Progress Notes (Signed)
   Subjective:    Adam Caldwell - 63 y.o. male MRN [id number]  Date of birth: [birth year]  CC:  Adam Caldwell is here for follow up of diabetes.  HPI:  Diabetes - takes metformin 1,000 mg BID and tolerating this well - does not regularly check his blood sugar - walks a few times per week for exercise - drinks tea and coffee with brown sugar daily -Recently went to [facility] for his ophthalmology appointment -Has no associated concerns  Health Maintenance:  - plans to get colonoscopy sometime this year Health Maintenance Due  Topic Date Due  . OPHTHALMOLOGY EXAM  09/27/1966  . COLONOSCOPY  09/27/2006    -  reports that he has never smoked. He has never used smokeless tobacco. - Review of Systems: Per HPI. - Past Medical History: Patient Active Problem List   Diagnosis Date Noted  . Positive PPD 10/31/2017  . Taking multiple medications for chronic disease 06/11/2017  . Well controlled type 2 diabetes mellitus (HCC) 06/11/2017  . Health care maintenance 12/03/2014  . Plantar fasciitis of right foot 12/03/2014  . Other proteinuria 09/12/2006  . HYPERCHOLESTEROLEMIA 03/29/2006  . OBESITY, NOS 03/29/2006  . HYPERTENSION, BENIGN SYSTEMIC 03/29/2006   - Medications: reviewed and updated   Objective:   Physical Exam BP 120/70   Pulse 76   Temp 98.8 F (37.1 C) (Oral)   Wt 284 lb 2 oz (128.9 kg)   SpO2 99%   BMI 40.77 kg/m  Gen: NAD, alert, cooperative with exam, well-appearing CV: RRR, good S1/S2, no murmur Resp: CTABL, no wheezes, non-labored Abd: SNTND, BS present, no guarding or organomegaly Skin: no rashes, normal turgor  Psych: good insight, alert and oriented     Assessment & Plan:   Well controlled type 2 diabetes mellitus (HCC) A1c is 6.7 today, only slightly elevated from 6.6 four months ago.  Will continue metformin 1000 mg twice daily and counseled patient on reducing daily sugar intake and increasing his physical activity.  Patient was amenable to  these changes and will try to reduce the amount of sugar he adds to his coffee and tea as his first goal.  We will plan to see again in 3 months.    Lezlie OctaveAmanda Melah Ebling, M.D. 02/01/2018, 4:56 PM PGY-2, [facility]

## 2018-06-07 ENCOUNTER — Other Ambulatory Visit: Payer: Self-pay | Admitting: Family Medicine

## 2018-06-08 ENCOUNTER — Other Ambulatory Visit: Payer: Self-pay | Admitting: Family Medicine

## 2018-09-08 ENCOUNTER — Other Ambulatory Visit: Payer: Self-pay | Admitting: Family Medicine

## 2018-09-10 ENCOUNTER — Other Ambulatory Visit: Payer: Self-pay | Admitting: Family Medicine

## 2018-09-23 ENCOUNTER — Emergency Department (HOSPITAL_COMMUNITY)
Admission: EM | Admit: 2018-09-23 | Discharge: 2018-09-23 | Disposition: A | Discharge status: Home / Self Care | Payer: BC Managed Care – PPO | Attending: Emergency Medicine | Admitting: Emergency Medicine

## 2018-09-23 ENCOUNTER — Other Ambulatory Visit: Payer: Self-pay

## 2018-09-23 ENCOUNTER — Encounter (HOSPITAL_COMMUNITY): Payer: Self-pay | Admitting: Emergency Medicine

## 2018-09-23 DIAGNOSIS — E669 Obesity, unspecified: Secondary | ICD-10-CM | POA: Diagnosis not present

## 2018-09-23 DIAGNOSIS — Z7984 Long term (current) use of oral hypoglycemic drugs: Secondary | ICD-10-CM | POA: Diagnosis not present

## 2018-09-23 DIAGNOSIS — Z79899 Other long term (current) drug therapy: Secondary | ICD-10-CM | POA: Insufficient documentation

## 2018-09-23 DIAGNOSIS — E119 Type 2 diabetes mellitus without complications: Secondary | ICD-10-CM | POA: Insufficient documentation

## 2018-09-23 DIAGNOSIS — Z20828 Contact with and (suspected) exposure to other viral communicable diseases: Secondary | ICD-10-CM | POA: Insufficient documentation

## 2018-09-23 DIAGNOSIS — Z03818 Encounter for observation for suspected exposure to other biological agents ruled out: Secondary | ICD-10-CM | POA: Diagnosis not present

## 2018-09-23 DIAGNOSIS — Z7982 Long term (current) use of aspirin: Secondary | ICD-10-CM | POA: Insufficient documentation

## 2018-09-23 DIAGNOSIS — I10 Essential (primary) hypertension: Secondary | ICD-10-CM | POA: Insufficient documentation

## 2018-09-23 DIAGNOSIS — Z7189 Other specified counseling: Secondary | ICD-10-CM

## 2018-09-23 NOTE — ED Notes (Signed)
Pt verbalizes understanding of d/c instructions. Pt ambulatory at d/c with all belongings and with family.   

## 2018-09-23 NOTE — ED Triage Notes (Signed)
Pt states that his wife works with someone who had a recent covid positive test on Saturday. Pt wants to get tested as a  Precaution, he denies any symptoms.

## 2018-09-23 NOTE — ED Provider Notes (Signed)
[facility] EMERGENCY DEPARTMENT Provider Note   CSN: [id number] Arrival date & time: 09/23/18  1935     History   Chief Complaint Chief Complaint  Patient presents with  . covid exposure    HPI Adam Caldwell is a 63 y.o. male who presents for COVID testing. PMH significant for HTN, HLD, DM.  The patient states that he is here for COVID testing because his wife was working around a COVID positive person at [employer] 2 days ago. He denies any symptoms. He states he was told he could go to UC or here but thought it might be quicker to come to the ED.     HPI  Past Medical History:  Diagnosis Date  . Hyperlipidemia   . Hypertension     Patient Active Problem List   Diagnosis Date Noted  . Positive PPD 10/31/2017  . Taking multiple medications for chronic disease 06/11/2017  . Well controlled type 2 diabetes mellitus ([place]) 06/11/2017  . Health care maintenance 12/03/2014  . Plantar fasciitis of right foot 12/03/2014  . Other proteinuria 09/12/2006  . HYPERCHOLESTEROLEMIA 03/29/2006  . OBESITY, NOS 03/29/2006  . HYPERTENSION, BENIGN SYSTEMIC 03/29/2006    History reviewed. No pertinent surgical history.      Home Medications    Prior to Admission medications   Medication Sig Start Date End Date Taking? Authorizing Provider  aspirin (ASPIR-LOW) 81 MG EC tablet Take 81 mg by mouth daily. Patient preference     [provider]  atorvastatin (LIPITOR) 40 MG tablet TAKE ONE TABLET BY MOUTH DAILY 09/11/18   Kathrene Alu, MD  Calcium Carbonate-Vitamin D (OS-CAL 500 + D) 500-500 MG-UNIT CHEW Chew by mouth daily. Take 2 tabs     [provider]  lisinopril-hydrochlorothiazide (ZESTORETIC) 20-12.5 MG tablet TAKE TWO TABLETS BY MOUTH DAILY 06/10/18   Winfrey, Alcario Drought, MD  metFORMIN (GLUCOPHAGE) 500 MG tablet TAKE TWO TABLETS BY MOUTH TWICE A DAY WITH A MEAL. Patient taking differently: Take 1,000 mg by mouth 2 (two) times daily with a  meal.  06/07/18   Winfrey, Alcario Drought, MD  Multiple Vitamins-Minerals ([employer] SPECTRAVITE) TABS Take by mouth daily.      [provider]    Family History No family history on file.  Social History Social History   Tobacco Use  . Smoking status: Never Smoker  . Smokeless tobacco: Never Used  Substance Use Topics  . Alcohol use: No  . Drug use: No     Allergies   Penicillins   Review of Systems Review of Systems  Constitutional: Negative for fever.  Respiratory: Negative for shortness of breath.   Cardiovascular: Negative for chest pain.     Physical Exam Updated Vital Signs BP 130/77 (BP Location: Right Arm)   Pulse 74   Temp 98.5 F (36.9 C) (Oral)   Resp 18   SpO2 100%   Physical Exam Vitals signs and nursing note reviewed.  Constitutional:      General: He is not in acute distress.    Appearance: He is well-developed. He is obese. He is not ill-appearing.  HENT:     Head: Normocephalic and atraumatic.  Eyes:     General: No scleral icterus.       Right eye: No discharge.        Left eye: No discharge.     Conjunctiva/sclera: Conjunctivae normal.     Pupils: Pupils are equal, round, and reactive to light.  Neck:  Musculoskeletal: Normal range of motion.  Cardiovascular:     Rate and Rhythm: Normal rate and regular rhythm.  Pulmonary:     Effort: Pulmonary effort is normal. No respiratory distress.     Breath sounds: Normal breath sounds.  Abdominal:     General: There is no distension.  Skin:    General: Skin is warm and dry.  Neurological:     Mental Status: He is alert and oriented to person, place, and time.  Psychiatric:        Behavior: Behavior normal.      ED Treatments / Results  Labs (all labs ordered are listed, but only abnormal results are displayed) Labs Reviewed  NOVEL CORONAVIRUS, NAA (HOSP ORDER, SEND-OUT TO REF LAB; TAT 18-24 HRS)    EKG None  Radiology No results found.  Procedures Procedures (including  critical care time)  Medications Ordered in ED Medications - No data to display   Initial Impression / Assessment and Plan / ED Course  I have reviewed the triage vital signs and the nursing notes.  Pertinent labs & imaging results that were available during my care of the patient were reviewed by me and considered in my medical decision making (see chart for details).  63 year old male presents for COVID testing.  His vitals are normal.  His exam is unremarkable.  He actually does not meet the criteria for testing as his wife has been around a COVID positive patient but he has not.  Neither him nor his wife have any symptoms.  The COVID test was obtained in triage however and already sent before I talked to him.  He was advised to self quarantine and await results in the next 2 to 3 days.  Final Clinical Impressions(s) / ED Diagnoses   Final diagnoses:  Advice Given About Covid-19 Virus Infection    ED Discharge Orders    None       Bethel BornGekas, Aliesha Dolata Marie, PA-C 09/23/18 2235    Derwood KaplanNanavati, Ankit, MD 09/24/18 [phone]

## 2018-09-23 NOTE — Discharge Instructions (Addendum)
Quarantine at home until you get your results which should take 2-3 days °

## 2018-09-25 LAB — NOVEL CORONAVIRUS, NAA (HOSP ORDER, SEND-OUT TO REF LAB; TAT 18-24 HRS): SARS-CoV-2, NAA: NOT DETECTED

## 2018-10-21 ENCOUNTER — Other Ambulatory Visit: Payer: Self-pay

## 2018-10-21 ENCOUNTER — Ambulatory Visit (INDEPENDENT_AMBULATORY_CARE_PROVIDER_SITE_OTHER): Payer: BC Managed Care – PPO | Admitting: Family Medicine

## 2018-10-21 ENCOUNTER — Encounter: Payer: Self-pay | Admitting: Family Medicine

## 2018-10-21 VITALS — BP 104/58 | HR 85 | Wt 275.8 lb

## 2018-10-21 DIAGNOSIS — I10 Essential (primary) hypertension: Secondary | ICD-10-CM | POA: Diagnosis not present

## 2018-10-21 DIAGNOSIS — Z23 Encounter for immunization: Secondary | ICD-10-CM | POA: Diagnosis not present

## 2018-10-21 DIAGNOSIS — E559 Vitamin D deficiency, unspecified: Secondary | ICD-10-CM

## 2018-10-21 DIAGNOSIS — Z1211 Encounter for screening for malignant neoplasm of colon: Secondary | ICD-10-CM

## 2018-10-21 DIAGNOSIS — E119 Type 2 diabetes mellitus without complications: Secondary | ICD-10-CM | POA: Diagnosis not present

## 2018-10-21 DIAGNOSIS — Z0000 Encounter for general adult medical examination without abnormal findings: Secondary | ICD-10-CM

## 2018-10-21 DIAGNOSIS — Z125 Encounter for screening for malignant neoplasm of prostate: Secondary | ICD-10-CM | POA: Diagnosis not present

## 2018-10-21 LAB — POCT GLYCOSYLATED HEMOGLOBIN (HGB A1C): HbA1c, POC (controlled diabetic range): 6.7 % (ref 0.0–7.0)

## 2018-10-21 MED ORDER — METFORMIN HCL 1000 MG PO TABS: 1000.0000 mg | ORAL_TABLET | Freq: Two times a day (BID) | ORAL | 3 refills | Status: DC

## 2018-10-21 MED ORDER — MICROLET NEXT LANCING DEVICE MISC: 1.0000 | 0 refills | Status: AC | PRN

## 2018-10-21 MED ORDER — GLUCOSE BLOOD VI STRP: ORAL_STRIP | 12 refills | Status: DC

## 2018-10-21 MED ORDER — MICROLET LANCETS MISC: 0 refills | Status: AC

## 2018-10-21 MED ORDER — BAYER CONTOUR LINK MONITOR W/DEVICE KIT: PACK | 0 refills | Status: DC

## 2018-10-21 NOTE — Progress Notes (Signed)
   Subjective:    LATONYA KNIGHT - 63 y.o. male MRN [id number]  Date of birth: [birth year]  CC:  DELFIN SQUILLACE is here for his annual physical.  We will also follow up on his type 2 diabetes.  HPI:  T2DM - medications: metformin 1,000 mg BID, atorvastatin 40 mg daily - eye exam: is planning on scheduling this - foot exam: performed today - hyperglycemic symptoms: none - has lost 9 lbs since January 2020 due to reducing carbohydrates  Health Maintenance:  -Patient interested in yearly PSA, colonoscopy, tetanus/Tdap, and influenza vaccine Health Maintenance Due  Topic Date Due  . OPHTHALMOLOGY EXAM  09/27/1966  . COLONOSCOPY  09/27/2006    -  reports that he has never smoked. He has never used smokeless tobacco. - Review of Systems: Per HPI. - Past Medical History: Patient Active Problem List   Diagnosis Date Noted  . Positive PPD 10/31/2017  . Taking multiple medications for chronic disease 06/11/2017  . Well controlled type 2 diabetes mellitus ([place]) 06/11/2017  . Health care maintenance 12/03/2014  . Plantar fasciitis of right foot 12/03/2014  . Other proteinuria 09/12/2006  . HYPERCHOLESTEROLEMIA 03/29/2006  . OBESITY, NOS 03/29/2006  . HYPERTENSION, BENIGN SYSTEMIC 03/29/2006   - Medications: reviewed and updated   Objective:   Physical Exam BP (!) 104/58   Pulse 85   Wt 275 lb 12.8 oz (125.1 kg)   SpO2 100%   BMI 39.57 kg/m  Gen: NAD, alert, cooperative with exam, well-appearing, obese, pleasant CV: RRR, good S1/S2, no murmur, no edema Resp: CTABL, no wheezes, non-labored Abd: SNTND, BS present, no guarding or organomegaly Skin: no rashes, normal turgor  Neuro: no gross deficits.  Psych: good insight, alert and oriented    Diabetic Foot Exam - Simple   Simple Foot Form Diabetic Foot exam was performed with the following findings: Yes 10/23/2018  1:13 PM  Visual Inspection No deformities, no ulcerations, no other skin breakdown bilaterally: Yes Sensation  Testing Intact to touch and monofilament testing bilaterally: Yes Pulse Check Posterior Tibialis and Dorsalis pulse intact bilaterally: Yes Comments     Assessment & Plan:   Well controlled type 2 diabetes mellitus ([place]) Hemoglobin A1c is unchanged at 6.9 today.  Patient was Congratulated on his continued good control and his 9 pound weight loss.  We will change his metformin to 1000 mg tablets twice daily rather than two 500 mg tablets twice daily.  We will continue atorvastatin.  Health care maintenance Will obtain PSA, lipid panel, BMP today.  Patient was referred for a colonoscopy and plans to get an eye exam as well.  Will obtain vitamin D level since patient is taking chronic vitamin D.  Influenza and tetanus/Tdap vaccines were given today.    Maia Breslow, M.D. 10/23/2018, 1:30 PM PGY-3, [place]

## 2018-10-21 NOTE — Patient Instructions (Addendum)
It was nice seeing you today Adam Caldwell!  Today, we checked your PSA, cholesterol level, electrolytes and kidney function.  We also checked your hemoglobin A1c, which is holding steady at 6.7.  This is a very good number and means that your diabetes is well controlled.  Continue your metformin 1000 mg twice daily.  I have sent in the new version so that you only need to take 1 pill twice daily.  Please make an appointment with your eye doctor and make sure that the [place] office calls you in the next few weeks for your colonoscopy.  Congratulations on your weight loss!  Good job in your hard work.  If you have any questions or concerns, please feel free to call the clinic.   Be well,  Dr. Shan Levans

## 2018-10-22 ENCOUNTER — Telehealth: Payer: Self-pay

## 2018-10-22 LAB — BASIC METABOLIC PANEL
BUN/Creatinine Ratio: 14 (ref 10–24)
BUN: 13 mg/dL (ref 8–27)
CO2: 25 mmol/L (ref 20–29)
Calcium: 10.1 mg/dL (ref 8.6–10.2)
Chloride: 98 mmol/L (ref 96–106)
Creatinine, Ser: 0.91 mg/dL (ref 0.76–1.27)
GFR calc Af Amer: 104 mL/min/{1.73_m2} (ref >59)
GFR calc non Af Amer: 90 mL/min/{1.73_m2} (ref >59)
Glucose: 83 mg/dL (ref 65–99)
Potassium: 3.4 mmol/L — ABNORMAL LOW (ref 3.5–5.2)
Sodium: 140 mmol/L (ref 134–144)

## 2018-10-22 LAB — LIPID PANEL
Chol/HDL Ratio: 2.4 ratio (ref 0.0–5.0)
Cholesterol, Total: 113 mg/dL (ref 100–199)
HDL: 48 mg/dL (ref >39)
LDL Chol Calc (NIH): 49 mg/dL (ref 0–99)
Triglycerides: 77 mg/dL (ref 0–149)
VLDL Cholesterol Cal: 16 mg/dL (ref 5–40)

## 2018-10-22 LAB — PSA: Prostate Specific Ag, Serum: 0.7 ng/mL (ref 0.0–4.0)

## 2018-10-22 LAB — VITAMIN D 25 HYDROXY (VIT D DEFICIENCY, FRACTURES): Vit D, 25-Hydroxy: 41.7 ng/mL (ref 30.0–100.0)

## 2018-10-22 NOTE — Telephone Encounter (Signed)
Received fax from pharmacy stating patients test need to be more specific. What type of test strips and how often they are testing. Please advise.

## 2018-10-23 ENCOUNTER — Encounter: Payer: Self-pay | Admitting: Family Medicine

## 2018-10-23 NOTE — Assessment & Plan Note (Signed)
Hemoglobin A1c is unchanged at 6.9 today.  Patient was Congratulated on his continued good control and his 9 pound weight loss.  We will change his metformin to 1000 mg tablets twice daily rather than two 500 mg tablets twice daily.  We will continue atorvastatin.

## 2018-10-23 NOTE — Assessment & Plan Note (Addendum)
Will obtain PSA, lipid panel, BMP today.  Patient was referred for a colonoscopy and plans to get an eye exam as well.  Will obtain vitamin D level since patient is taking chronic vitamin D.  Influenza and tetanus/Tdap vaccines were given today.

## 2019-02-10 ENCOUNTER — Encounter: Payer: Self-pay | Admitting: Family Medicine

## 2019-02-10 ENCOUNTER — Other Ambulatory Visit: Payer: Self-pay | Admitting: Family Medicine

## 2019-02-10 MED ORDER — CONTOUR NEXT ONE KIT: 1.0000 "application " | PACK | Freq: Every day | 1 refills | Status: AC

## 2019-02-10 MED ORDER — CONTOUR NEXT TEST VI STRP: ORAL_STRIP | 12 refills | Status: AC

## 2019-09-29 ENCOUNTER — Other Ambulatory Visit: Payer: Self-pay

## 2019-09-29 MED ORDER — LISINOPRIL-HYDROCHLOROTHIAZIDE 20-12.5 MG PO TABS: 2.0000 | ORAL_TABLET | Freq: Every day | ORAL | 0 refills | Status: DC

## 2019-09-29 MED ORDER — ATORVASTATIN CALCIUM 40 MG PO TABS: 40.0000 mg | ORAL_TABLET | Freq: Every day | ORAL | 2 refills | Status: DC

## 2019-09-30 ENCOUNTER — Other Ambulatory Visit: Payer: Self-pay | Admitting: *Deleted

## 2019-09-30 MED ORDER — METFORMIN HCL 1000 MG PO TABS: 1000.0000 mg | ORAL_TABLET | Freq: Two times a day (BID) | ORAL | 3 refills | Status: DC

## 2019-10-10 ENCOUNTER — Ambulatory Visit (INDEPENDENT_AMBULATORY_CARE_PROVIDER_SITE_OTHER): Payer: BC Managed Care – PPO | Admitting: Family Medicine

## 2019-10-10 ENCOUNTER — Ambulatory Visit (HOSPITAL_COMMUNITY)
Admission: RE | Admit: 2019-10-10 | Discharge: 2019-10-10 | Disposition: A | Discharge status: Home / Self Care | Payer: BC Managed Care – PPO | Source: Ambulatory Visit | Attending: Internal Medicine | Admitting: Internal Medicine

## 2019-10-10 ENCOUNTER — Other Ambulatory Visit: Payer: Self-pay

## 2019-10-10 ENCOUNTER — Telehealth: Payer: Self-pay | Admitting: *Deleted

## 2019-10-10 ENCOUNTER — Encounter: Payer: Self-pay | Admitting: Family Medicine

## 2019-10-10 VITALS — BP 118/64 | HR 102 | Ht 70.0 in | Wt 268.0 lb

## 2019-10-10 DIAGNOSIS — Z23 Encounter for immunization: Secondary | ICD-10-CM

## 2019-10-10 DIAGNOSIS — E119 Type 2 diabetes mellitus without complications: Secondary | ICD-10-CM

## 2019-10-10 DIAGNOSIS — I10 Essential (primary) hypertension: Secondary | ICD-10-CM

## 2019-10-10 DIAGNOSIS — F439 Reaction to severe stress, unspecified: Secondary | ICD-10-CM

## 2019-10-10 DIAGNOSIS — Z1211 Encounter for screening for malignant neoplasm of colon: Secondary | ICD-10-CM

## 2019-10-10 DIAGNOSIS — Z0000 Encounter for general adult medical examination without abnormal findings: Secondary | ICD-10-CM

## 2019-10-10 LAB — POCT GLYCOSYLATED HEMOGLOBIN (HGB A1C): Hemoglobin A1C: 6.5 % — AB (ref 4.0–5.6)

## 2019-10-10 NOTE — Patient Instructions (Addendum)
Thank you for coming to see me today. It was a pleasure. Today we talked about:   You should pay attention to your hemoglobin A1C.  It is a three month test about your average blood sugar. If the A1C is - <7.0 is great.  That is our goal for treating you. - Between 7.0 and 9.0 is not so good.  We would need to work to do better. - Above 9.0 is terrible.  You would really need to work with [place] to get it under control.    Today's A1C = 6.5  I will MyChart you with the results of your blood work  I recommend you reach out to a therapist at this website, [email or web address] to help with stress management as well.  I have also sent a referral to [facility] and they will call you with an appointment  Please follow-up with PCP 3 months  If you have any questions or concerns, please do not hesitate to call the office at [phone].  Best,   Dana Allan, MD Family Medicine Res[facility]     Acute Coronary Syndrome Acute coronary syndrome (ACS) is a serious problem in which there is suddenly not enough blood and oxygen reaching the heart. ACS can result in chest pain or a heart attack. This condition is a medical emergency. If you have any symptoms of this condition, get help right away. What are the causes? This condition may be caused by:  A buildup of fat and cholesterol inside the arteries (atherosclerosis). This is the most common cause. The buildup (plaque) can cause blood vessels in the heart (coronary arteries) to become narrow or blocked, which reduces blood flow to the heart. Plaque can also break off and lead to a clot, which can block an artery and cause a heart attack or stroke.  Sudden tightening of the muscles around the coronary arteries (coronary spasm).  Tearing of a coronary artery (spontaneous coronary artery dissection).  Very low blood pressure (hypotension).  An abnormal heartbeat (arrhythmia).  Other medical conditions that cause a decrease of oxygen to the heart, such  as anemiaorrespiratory failure.  Using cocaine or methamphetamine. What increases the risk? The following factors may make you more likely to develop this condition:  Age. The risk for ACS increases as you get older.  History of chest pain, heart attack, peripheral artery disease, or stroke.  Having taken chemotherapy or immune-suppressing medicines.  Being male.  Family history of chest pain, heart disease, or stroke.  Smoking.  Not exercising enough.  Being overweight.  High cholesterol.  High blood pressure (hypertension).  Diabetes.  Excessive alcohol use. What are the signs or symptoms? Common symptoms of this condition include:  Chest pain. The pain may last a long time, or it may stop and come back (recur). It may feel like: ? Crushing or squeezing. ? Tightness, pressure, fullness, or heaviness.  Arm, neck, jaw, or back pain.  Heartburn or indigestion.  Shortness of breath.  Nausea.  Sudden cold sweats.  Light-headedness.  Dizziness or passing out.  Tiredness (fatigue). Sometimes there are no symptoms. How is this diagnosed? This condition may be diagnosed based on:  Your medical history and symptoms.  Imaging tests, such as: ? An electrocardiogram (ECG). This measures the heart's electrical activity. ? X-rays. ? CT scan. ? A coronary angiogram. For this test, dye is injected into the heart arteries and then X-rays are taken. ? Myocardial perfusion imaging. This test shows how well blood flows through your heart  muscle.  Blood tests. These may be repeated at certain time intervals.  Exercise stress testing.  Echocardiogram. This is a test that uses sound waves to produce detailed images of the heart. How is this treated? Treatment for this condition may include:  Oxygen therapy.  Medicines, such as: ? Antiplatelet medicines and blood-thinning medicines, such as aspirin. These help prevent blood clots. ? Medicine that dissolves any  blood clots (fibrinolytic therapy). ? Blood pressure medicines. ? Nitroglycerin. This helps widen blood vessels to improve blood flow. ? Pain medicine. ? Cholesterol-lowering medicine.  Surgery, such as: ? Coronary angioplasty with stent placement. This involves placing a small piece of metal that looks like mesh or a spring into a narrow coronary artery. This widens the artery and keeps it open. ? Coronary artery bypass surgery. This involves taking a section of a blood vessel from a different part of your body and placing it on the blocked coronary artery to allow blood to flow around the blockage.  Cardiac rehabilitation. This is a program that includes exercise training, education, and counseling to help you recover. Follow these instructions at home: Eating and drinking  Eat a heart-healthy diet that includes whole grains, fruits and vegetables, lean proteins, and low-fat or nonfat dairy products.  Limit how much salt (sodium) you eat as told by your health care provider. Follow instructions from your health care provider about any other eating or drinking restrictions, such as limiting foods that are high in fat and processed sugars.  Use healthy cooking methods such as roasting, grilling, broiling, baking, poaching, steaming, or stir-frying.  Work with a dietitian to follow a heart-healthy eating plan. Medicines  Take over-the-counter and prescription medicines only as told by your health care provider.  Do not take these medicines unless your health care provider approves: ? Vitamin supplements that contain vitamin A or vitamin E. ? NSAIDs, such as ibuprofen, naproxen, or celecoxib. ? Hormone replacement therapy that contains estrogen.  If you are taking blood thinners: ? Talk with your health care provider before you take any medicines that contain aspirin or NSAIDs. These medicines increase your risk for dangerous bleeding. ? Take your medicine exactly as told, at the same  time every day. ? Avoid activities that could cause injury or bruising, and follow instructions about how to prevent falls. ? Wear a medical alert bracelet, and carry a card that lists what medicines you take. Activity  Follow your cardiac rehabilitation program. Do exercises as told by your physical therapist.  Ask your health care provider what activities and exercises are safe for you. Follow his or her instructions about lifting, driving, or climbing stairs. Lifestyle  Do not use any products that contain nicotine or tobacco, such as cigarettes, e-cigarettes, and chewing tobacco. If you need help quitting, ask your health care provider.  Do not drink alcohol if your health care provider tells you not to drink.  If you drink alcohol: ? Limit how much you have to 0-1 drink a day. ? Be aware of how much alcohol is in your drink. In the [place] one drink equals one 12 oz bottle of beer (355 mL), one 5 oz glass of wine (148 mL), or one 1 oz glass of hard liquor (44 mL).  Maintain a healthy weight. If you need to lose weight, work with your health care provider to do so safely. General instructions  Tell all the health care providers who provide care for you about your heart condition, including your dentist. This  may affect the medicines or treatment you receive.  Manage any other health conditions you have, such as hypertension or diabetes. These conditions affect your heart.  Pay attention to your mental health. You may be at higher risk for depression. ? Find ways to manage stress. ? Talk to your health care provider about depression screening and treatment.  Keep your vaccinations up to date. ? Get the flu shot (influenza vaccine) every year. ? Get the pneumococcal vaccine if you are age 44 or older.  If directed, monitor your blood pressure at home.  Keep all follow-up visits as told by your health care provider. This is important. Contact a health care provider if you:  Feel  overwhelmed or sad.  Have trouble doing your daily activities. Get help right away if you:  Have pain in your chest, neck, arm, jaw, stomach, or back that recurs, and: ? It lasts for more than a few minutes. ? It is not relieved by taking the medicineyour health care provider prescribed.  Have unexplained: ? Heavy sweating. ? Heartburn or indigestion. ? Nausea or vomiting. ? Shortness of breath. ? Difficulty breathing. ? Fatigue. ? Nervousness or anxiety. ? Weakness. ? Diarrhea. ? Dark stools or blood in your stool.  Have sudden light-headedness or dizziness.  Have blood pressure that is higher than 180/120.  Faint.  Have thoughts about hurting yourself. These symptoms may represent a serious problem that is an emergency. Do not wait to see if the symptoms will go away. Get medical help right away. Call your local emergency services (911 in the [place] Do not drive yourself to the hospital.  Summary  Acute coronary syndrome (ACS) is when there is not enough blood and oxygen being supplied to the heart. ACS can result in chest pain or a heart attack.  Acute coronary syndrome is a medical emergency. If you have any symptoms of this condition, get help right away.  Treatment includes medicines and procedures to open the blocked arteries and restore blood flow. This information is not intended to replace advice given to you by your health care provider. Make sure you discuss any questions you have with your health care provider. Document Revised: 06/19/2018 Document Reviewed: 01/28/2018 Elsevier Patient Education  2020 [employer].

## 2019-10-10 NOTE — Chronic Care Management (AMB) (Signed)
  Care Management   Note  10/10/2019 Name: Adam Caldwell MRN: [id number] DOB: 1955-06-23  Adam Caldwell is a 64 y.o. year old male who is a primary care patient of Fayette Pho, MD. I reached out to Adam Caldwell by phone today in response to a referral sent by Adam Caldwell PCP, Fayette Pho, MD.  Adam Caldwell was given information about care management services today including:  1. Care management services include personalized support from designated clinical staff supervised by his physician, including individualized plan of care and coordination with other care providers 2. 24/7 contact phone numbers for assistance for urgent and routine care needs. 3. The patient may stop care management services at any time by phone call to the office staff.  Patient agreed to services and verbal consent obtained.   Follow up plan: Telephone appointment with care management team member scheduled for:10/21/2019  [facility] Guide, Embedded Care Coordination [facility] Management

## 2019-10-10 NOTE — Progress Notes (Signed)
    SUBJECTIVE:   CHIEF COMPLAINT / HPI: for physical  No acute concerns today. Feeling well.  Denies any dyspnea or chest pain, headaches or visual disturbances.  No polyuria or polydipsia.    Mental health Patient reports that he is having some stress at home.  He was the primary caregiver for his mother with Alzheimers who passed away in May 17, 2017.  He is requesting some help with management of stress.  He reports that his brother was recent;y diagnosed with Atherosclerosis and he is concerned that he may have this and was wondering if there was some test to have done to check for this.   PERTINENT  PMH / PSH:  HTN Type 2 Dm  OBJECTIVE:   BP 118/64   Pulse (!) 102   Ht 5\' 10"  (1.778 m)   Wt 268 lb (121.6 kg)   SpO2 98%   BMI 38.45 kg/m    General: Alert and oriented, no apparent distress  Cardiovascular: RRR with no murmurs noted Respiratory: CTA bilaterally  Gastrointestinal: Bowel sounds present. No abdominal pain MSK: Upper extremity strength 5/5 bilaterally, Lower extremity strength 5/5 bilaterally  Psych: Behavior and speech appropriate to situation.  No SI/HI  ASSESSMENT/PLAN:   Well controlled type 2 diabetes mellitus (HCC) No hypoglycemic events.  Normotensive. No polyuria or polydipsia -HbA1c 6.5 today, repeat in 3-6 months -BMet wnl today -Lipid panel wnl -Continue Metformin 1000 mg BID -Continue Lipitor 40 mg daily -Discussed exercise and diet  -ECG SR without blockage or STEMI -Follow up with PCP   Health care maintenance Referral to GI for colonoscopy Received COVID vaccines x2 -Flu vaccine today -EYE exam later this year per patient -Follow up with PCP as needed  Stress at home [facility] referral to help with stress management -Mental health resources provided  -Follow up with PCP in 1-2 weeks     , MD [facility]

## 2019-10-11 LAB — LIPID PANEL
Chol/HDL Ratio: 2.7 ratio (ref 0.0–5.0)
Cholesterol, Total: 119 mg/dL (ref 100–199)
HDL: 44 mg/dL (ref >39)
LDL Chol Calc (NIH): 55 mg/dL (ref 0–99)
Triglycerides: 107 mg/dL (ref 0–149)
VLDL Cholesterol Cal: 20 mg/dL (ref 5–40)

## 2019-10-11 LAB — BASIC METABOLIC PANEL
BUN/Creatinine Ratio: 11 (ref 10–24)
BUN: 12 mg/dL (ref 8–27)
CO2: 26 mmol/L (ref 20–29)
Calcium: 9.9 mg/dL (ref 8.6–10.2)
Chloride: 100 mmol/L (ref 96–106)
Creatinine, Ser: 1.08 mg/dL (ref 0.76–1.27)
GFR calc Af Amer: 84 mL/min/{1.73_m2} (ref >59)
GFR calc non Af Amer: 73 mL/min/{1.73_m2} (ref >59)
Glucose: 117 mg/dL — ABNORMAL HIGH (ref 65–99)
Potassium: 4 mmol/L (ref 3.5–5.2)
Sodium: 138 mmol/L (ref 134–144)

## 2019-10-12 ENCOUNTER — Encounter: Payer: Self-pay | Admitting: Family Medicine

## 2019-10-12 DIAGNOSIS — F439 Reaction to severe stress, unspecified: Secondary | ICD-10-CM | POA: Insufficient documentation

## 2019-10-12 NOTE — Assessment & Plan Note (Signed)
Referral to GI for colonoscopy Received COVID vaccines x2 -Flu vaccine today -EYE exam later this year per patient -Follow up with PCP as needed

## 2019-10-12 NOTE — Assessment & Plan Note (Signed)
[facility] referral to help with stress management -Mental health resources provided  -Follow up with PCP in 1-2 weeks

## 2019-10-12 NOTE — Assessment & Plan Note (Addendum)
No hypoglycemic events.  Normotensive. No polyuria or polydipsia -HbA1c 6.5 today, repeat in 3-6 months -BMet wnl today -Lipid panel wnl -Continue Metformin 1000 mg BID -Continue Lipitor 40 mg daily -Discussed exercise and diet  -ECG SR without blockage or STEMI -Follow up with PCP

## 2019-10-14 ENCOUNTER — Other Ambulatory Visit: Payer: Self-pay | Admitting: *Deleted

## 2019-10-14 MED ORDER — LISINOPRIL-HYDROCHLOROTHIAZIDE 20-12.5 MG PO TABS: 2.0000 | ORAL_TABLET | Freq: Every day | ORAL | 0 refills | Status: DC

## 2019-10-20 ENCOUNTER — Telehealth: Payer: Self-pay

## 2019-10-20 NOTE — Telephone Encounter (Signed)
Patient calls nurse line requesting rx for lisinopril-hctz be changed to a 90- day supply as this is more cost efficient. Please advise if we can authorize change with pharmacy to change from 30 day to 90 day supply.   To PCP  Veronda Prude, RN

## 2019-10-21 ENCOUNTER — Telehealth: Payer: Self-pay | Admitting: Licensed Clinical Social Worker

## 2019-10-21 ENCOUNTER — Other Ambulatory Visit: Payer: Self-pay | Admitting: Family Medicine

## 2019-10-21 ENCOUNTER — Telehealth: Payer: BC Managed Care – PPO

## 2019-10-21 DIAGNOSIS — I10 Essential (primary) hypertension: Secondary | ICD-10-CM

## 2019-10-21 MED ORDER — LISINOPRIL-HYDROCHLOROTHIAZIDE 20-12.5 MG PO TABS: 2.0000 | ORAL_TABLET | Freq: Every day | ORAL | 0 refills | Status: DC

## 2019-10-21 NOTE — Progress Notes (Signed)
Changed prescription to 90 day supply per patient request, as this is more cost efficient.

## 2019-10-21 NOTE — Chronic Care Management (AMB) (Signed)
    Clinical Social Work  Care Management Outreach   10/21/2019 Name: [name] MRN: [id number] DOB: October 28, 1955 Adam Caldwell is a 64 y.o. year old male who is a primary care patient of Fayette Pho, MD .  The Care Management team was consulted by Dr. Clent Ridges for assistance with Grief Counseling and stress management options.   LCSW reached out to Adam Caldwell today by phone twice to introduce self, assess needs and offer Care Management services and interventions.  Both outreach were unsuccessful. A HIPPA compliant phone message was left for the patient providing contact information and requesting a return call.   Plan: LCSW will wait for return call.  If no return call is received will ask [facility] care team to reach to patient to see if he is interesting in rescheduling the phone appointment.   Review of patient status, including review of consultants reports, relevant laboratory and other test results, and collaboration with appropriate care team members and the patient's provider was performed as part of comprehensive patient evaluation and provision of care management services.    Sammuel Hines, LCSW Care Management & Coordination  [facility] / [facility]   [phone] 9:37 AM

## 2019-10-27 ENCOUNTER — Other Ambulatory Visit: Payer: Self-pay

## 2019-10-27 DIAGNOSIS — I10 Essential (primary) hypertension: Secondary | ICD-10-CM

## 2019-10-27 MED ORDER — LISINOPRIL-HYDROCHLOROTHIAZIDE 20-12.5 MG PO TABS: 2.0000 | ORAL_TABLET | Freq: Every day | ORAL | 0 refills | Status: DC

## 2019-10-27 NOTE — Progress Notes (Signed)
Patient walks into clinic to complain about not receiving a 90 day supply of blood pressure meds. Looks like it was not sent in, it was set to print. Will send in now.

## 2019-10-28 ENCOUNTER — Telehealth: Payer: Self-pay | Admitting: *Deleted

## 2019-10-28 NOTE — Chronic Care Management (AMB) (Signed)
  Care Management   Note  10/28/2019 Name: [name] MRN: [id number] DOB: Jan 28, 1956  Adam Caldwell is a 64 y.o. year old male who is a primary care patient of Fayette Pho, MD and is actively engaged with the care management team. I reached out to Adam Caldwell by phone today to assist with re-scheduling an initial visit with the Licensed Clinical Child psychotherapist.  Follow up plan: Unsuccessful telephone outreach attempt made. A HIPAA compliant phone message was left for the patient providing contact information and requesting a return call.  The care management team will reach out to the patient again over the next 7 days.  If patient returns call to provider office, please advise to call Embedded Care Management Care Guide Gwenevere Ghazi at [phone].  Gwenevere Ghazi  Care Guide, Embedded Care Coordination [facility] Management

## 2019-11-03 NOTE — Chronic Care Management (AMB) (Signed)
  Care Management   Note  11/03/2019 Name: Adam Caldwell MRN: [id number] DOB: Nov 22, 1955  Adam Caldwell is a 64 y.o. year old male who is a primary care patient of Fayette Pho, MD and is actively engaged with the care management team. I reached out to Adam Caldwell by phone today to assist with re-scheduling an initial visit with the Licensed Clinical Child psychotherapist.  Follow up plan: Unsuccessful telephone outreach attempt made. A HIPAA compliant phone message was left for the patient providing contact information and requesting a return call.  The care management team will reach out to the patient again over the next 7 days.  If patient returns call to provider office, please advise to call Embedded Care Management Care Guide Gwenevere Ghazi at [phone].  Gwenevere Ghazi  Care Guide, Embedded Care Coordination [facility] Management

## 2019-11-09 ENCOUNTER — Encounter (HOSPITAL_COMMUNITY): Payer: Self-pay

## 2019-11-09 ENCOUNTER — Ambulatory Visit (HOSPITAL_COMMUNITY)
Admission: EM | Admit: 2019-11-09 | Discharge: 2019-11-09 | Disposition: A | Discharge status: Home / Self Care | Payer: Self-pay | Attending: Family Medicine | Admitting: Family Medicine

## 2019-11-09 ENCOUNTER — Other Ambulatory Visit: Payer: Self-pay

## 2019-11-09 ENCOUNTER — Ambulatory Visit (INDEPENDENT_AMBULATORY_CARE_PROVIDER_SITE_OTHER): Payer: Self-pay

## 2019-11-09 DIAGNOSIS — M25561 Pain in right knee: Secondary | ICD-10-CM

## 2019-11-09 DIAGNOSIS — S161XXA Strain of muscle, fascia and tendon at neck level, initial encounter: Secondary | ICD-10-CM

## 2019-11-09 DIAGNOSIS — R0789 Other chest pain: Secondary | ICD-10-CM

## 2019-11-09 DIAGNOSIS — M79644 Pain in right finger(s): Secondary | ICD-10-CM

## 2019-11-09 MED ORDER — TIZANIDINE HCL 4 MG PO TABS: 4.0000 mg | ORAL_TABLET | Freq: Four times a day (QID) | ORAL | 0 refills | Status: DC | PRN

## 2019-11-09 MED ORDER — IBUPROFEN 800 MG PO TABS: 800.0000 mg | ORAL_TABLET | Freq: Three times a day (TID) | ORAL | 0 refills | Status: DC

## 2019-11-09 NOTE — Discharge Instructions (Signed)
Take ibuprofen 3 times a day with food. This is for pain and inflammation Take muscle relaxer as needed for stiff sore muscles. This is useful at bedtime Use ice or heat to sore muscles Rest See your doctor if not improving by next week
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
## 2019-11-10 NOTE — Chronic Care Management (AMB) (Signed)
  Care Management   Outreach Note  11/10/2019 Name: [name] MRN: [id number] DOB: 1955-10-21  Adam Caldwell is a 64 y.o. year old male who is a primary care patient of Fayette Pho, MD. I reached out to Adam Caldwell by phone today in response to a referral sent by Mr. HURSCHEL PAYNTER PCP, Fayette Pho, MD.     Third unsuccessful telephone outreach was attempted today. The patient was referred to the case management team for assistance with care management and care coordination. The patient's primary care provider has been notified of our unsuccessful attempts to make or maintain contact with the patient. The care management team is pleased to engage with this patient at any time in the future should he/she be interested in assistance from the care management team.   Follow Up Plan: A HIPAA compliant phone message was left for the patient providing contact information and requesting a return call. We have been unable to make contact with the patient. The care management team is available to follow up with the patient after provider conversation with the patient regarding recommendation for care management engagement and subsequent re-referral to the care management team.   [facility] Guide, Embedded Care Coordination [facility]  Care Management

## 2019-12-10 ENCOUNTER — Other Ambulatory Visit: Payer: Self-pay

## 2019-12-10 ENCOUNTER — Ambulatory Visit (INDEPENDENT_AMBULATORY_CARE_PROVIDER_SITE_OTHER): Payer: Self-pay | Admitting: Student in an Organized Health Care Education/Training Program

## 2019-12-10 ENCOUNTER — Encounter: Payer: Self-pay | Admitting: Student in an Organized Health Care Education/Training Program

## 2019-12-10 VITALS — BP 118/68 | HR 91 | Ht 70.0 in | Wt 269.2 lb

## 2019-12-10 DIAGNOSIS — M542 Cervicalgia: Secondary | ICD-10-CM

## 2019-12-10 DIAGNOSIS — S161XXD Strain of muscle, fascia and tendon at neck level, subsequent encounter: Secondary | ICD-10-CM

## 2019-12-10 MED ORDER — BACLOFEN 20 MG PO TABS: 20.0000 mg | ORAL_TABLET | Freq: Two times a day (BID) | ORAL | 0 refills | Status: DC | PRN

## 2019-12-10 MED ORDER — IBUPROFEN 600 MG PO TABS: 600.0000 mg | ORAL_TABLET | Freq: Three times a day (TID) | ORAL | 0 refills | Status: DC | PRN

## 2019-12-10 NOTE — Patient Instructions (Signed)
It was a pleasure to see you today!  To summarize our discussion for this visit:  I have sent in a muscle relaxer and an anti-inflammatory medication that you can take as needed but not necessary to take to the maximum prescribed if you feel you do not need it.  Physical therapy should offer a lot of help in recovery. Please expect a call from them in the next couple of weeks.  I have included some rehab exercises for your neck that you can start gently prior to PT  Some additional health maintenance measures we should update are: Health Maintenance Due  Topic Date Due  . OPHTHALMOLOGY EXAM  Never done  . COLONOSCOPY  Never done  . FOOT EXAM  10/21/2019  .    Call the clinic at [phone] if your symptoms worsen or you have any concerns.   Thank you for allowing me to take part in your care,  Dr. Jamelle Rushing   Cervical Strain and Sprain Rehab Ask your health care provider which exercises are safe for you. Do exercises exactly as told by your health care provider and adjust them as directed. It is normal to feel mild stretching, pulling, tightness, or discomfort as you do these exercises. Stop right away if you feel sudden pain or your pain gets worse. Do not begin these exercises until told by your health care provider. Stretching and range-of-motion exercises Cervical side bending  1. Using good posture, sit on a stable chair or stand up. 2. Without moving your shoulders, slowly tilt your left / right ear to your shoulder until you feel a stretch in the opposite side neck muscles. You should be looking straight ahead. 3. Hold for __________ seconds. 4. Repeat with the other side of your neck. Repeat __________ times. Complete this exercise __________ times a day. Cervical rotation  1. Using good posture, sit on a stable chair or stand up. 2. Slowly turn your head to the side as if you are looking over your left / right shoulder. ? Keep your eyes level with the  ground. ? Stop when you feel a stretch along the side and the back of your neck. 3. Hold for __________ seconds. 4. Repeat this by turning to your other side. Repeat __________ times. Complete this exercise __________ times a day. Thoracic extension and pectoral stretch 1. Roll a towel or a small blanket so it is about 4 inches (10 cm) in diameter. 2. Lie down on your back on a firm surface. 3. Put the towel lengthwise, under your spine in the middle of your back. It should not be under your shoulder blades. The towel should line up with your spine from your middle back to your lower back. 4. Put your hands behind your head and let your elbows fall out to your sides. 5. Hold for __________ seconds. Repeat __________ times. Complete this exercise __________ times a day. Strengthening exercises Isometric upper cervical flexion 1. Lie on your back with a thin pillow behind your head and a small rolled-up towel under your neck. 2. Gently tuck your chin toward your chest and nod your head down to look toward your feet. Do not lift your head off the pillow. 3. Hold for __________ seconds. 4. Release the tension slowly. Relax your neck muscles completely before you repeat this exercise. Repeat __________ times. Complete this exercise __________ times a day. Isometric cervical extension  1. Stand about 6 inches (15 cm) away from a wall, with your back facing  the wall. 2. Place a soft object, about 6-8 inches (15-20 cm) in diameter, between the back of your head and the wall. A soft object could be a small pillow, a ball, or a folded towel. 3. Gently tilt your head back and press into the soft object. Keep your jaw and forehead relaxed. 4. Hold for __________ seconds. 5. Release the tension slowly. Relax your neck muscles completely before you repeat this exercise. Repeat __________ times. Complete this exercise __________ times a day. Posture and body mechanics Body mechanics refers to the  movements and positions of your body while you do your daily activities. Posture is part of body mechanics. Good posture and healthy body mechanics can help to relieve stress in your body's tissues and joints. Good posture means that your spine is in its natural S-curve position (your spine is neutral), your shoulders are pulled back slightly, and your head is not tipped forward. The following are general guidelines for applying improved posture and body mechanics to your everyday activities. Sitting  1. When sitting, keep your spine neutral and keep your feet flat on the floor. Use a footrest, if necessary, and keep your thighs parallel to the floor. Avoid rounding your shoulders, and avoid tilting your head forward. 2. When working at a desk or a computer, keep your desk at a height where your hands are slightly lower than your elbows. Slide your chair under your desk so you are close enough to maintain good posture. 3. When working at a computer, place your monitor at a height where you are looking straight ahead and you do not have to tilt your head forward or downward to look at the screen. Standing   When standing, keep your spine neutral and keep your feet about hip-width apart. Keep a slight bend in your knees. Your ears, shoulders, and hips should line up.  When you do a task in which you stand in one place for a long time, place one foot up on a stable object that is 2-4 inches (5-10 cm) high, such as a footstool. This helps keep your spine neutral. Resting When lying down and resting, avoid positions that are most painful for you. Try to support your neck in a neutral position. You can use a contour pillow or a small rolled-up towel. Your pillow should support your neck but not push on it. This information is not intended to replace advice given to you by your health care provider. Make sure you discuss any questions you have with your health care provider. Document Revised: 05/08/2018  Document Reviewed: 10/17/2017 Elsevier Patient Education  2020 [employer].
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
## 2019-12-11 DIAGNOSIS — M542 Cervicalgia: Secondary | ICD-10-CM | POA: Insufficient documentation
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
## 2019-12-17 ENCOUNTER — Other Ambulatory Visit: Payer: Self-pay | Admitting: *Deleted

## 2019-12-17 ENCOUNTER — Other Ambulatory Visit: Payer: Self-pay

## 2019-12-17 DIAGNOSIS — Z125 Encounter for screening for malignant neoplasm of prostate: Secondary | ICD-10-CM

## 2019-12-17 DIAGNOSIS — Z1211 Encounter for screening for malignant neoplasm of colon: Secondary | ICD-10-CM

## 2019-12-17 NOTE — Progress Notes (Signed)
Patient: Adam Caldwell           Date of Birth: 06/28/1955           MRN: [id number] Visit Date: 12/17/2019 PCP: Fayette Pho, MD  Prostate Cancer Screening Date of last physical exam:  (2019) Date of last rectal exam:  (Around 5 years ago) Have you ever had any of the following?: None Have you ever had or been told you have an allergy to latex products?: No Are you currently taking any natural prostate preparations?: No Are you currently experiencing any urinary symptoms?: No  Prostate Exam Exam not completed.   PSA Only.  Patient's History Patient Active Problem List   Diagnosis Date Noted  . Neck pain 12/11/2019  . Stress at home 10/12/2019  . Positive PPD 10/31/2017  . Taking multiple medications for chronic disease 06/11/2017  . Well controlled type 2 diabetes mellitus ([facility]) 06/11/2017  . Health care maintenance 12/03/2014  . Plantar fasciitis of right foot 12/03/2014  . Other proteinuria 09/12/2006  . HYPERCHOLESTEROLEMIA 03/29/2006  . OBESITY, NOS 03/29/2006  . HYPERTENSION, BENIGN SYSTEMIC 03/29/2006   Past Medical History:  Diagnosis Date  . Hyperlipidemia   . Hypertension     No family history on file.  Social History   Occupational History  . Not on file  Tobacco Use  . Smoking status: Never Smoker  . Smokeless tobacco: Never Used  Substance and Sexual Activity  . Alcohol use: No  . Drug use: No  . Sexual activity: Not on file

## 2019-12-17 NOTE — Addendum Note (Signed)
Addended by: Narda Rutherford on: 12/17/2019 02:54 PM   Modules accepted: Orders

## 2019-12-19 LAB — PROSTATE-SPECIFIC AG, SERUM (LABCORP): Prostate Specific Ag, Serum: 0.4 ng/mL (ref 0.0–4.0)

## 2019-12-19 NOTE — Progress Notes (Signed)
PSA normal

## 2019-12-22 ENCOUNTER — Telehealth: Payer: Self-pay

## 2019-12-22 NOTE — Telephone Encounter (Signed)
Attempted to contact patient regarding lab results (PSA). Left message on voicemail requesting return call.  

## 2019-12-23 ENCOUNTER — Telehealth: Payer: Self-pay

## 2019-12-23 NOTE — Telephone Encounter (Signed)
Attempted to contact patient regarding lab (PSA) results. Left message on voicemail requesting return call.  

## 2020-01-06 ENCOUNTER — Other Ambulatory Visit: Payer: Self-pay | Admitting: Family Medicine

## 2020-01-06 ENCOUNTER — Encounter: Payer: Self-pay | Admitting: Family Medicine

## 2020-01-06 ENCOUNTER — Other Ambulatory Visit: Payer: Self-pay | Admitting: Student in an Organized Health Care Education/Training Program

## 2020-01-06 DIAGNOSIS — M542 Cervicalgia: Secondary | ICD-10-CM

## 2020-01-06 MED ORDER — LIDOCAINE-PRILOCAINE 2.5-2.5 % EX CREA: 1.0000 "application " | TOPICAL_CREAM | CUTANEOUS | 3 refills | Status: DC | PRN

## 2020-01-06 MED ORDER — DICLOFENAC SODIUM 1 % EX GEL: 2.0000 g | Freq: Four times a day (QID) | CUTANEOUS | 3 refills | Status: DC | PRN

## 2020-01-06 MED ORDER — BACLOFEN 20 MG PO TABS: 20.0000 mg | ORAL_TABLET | Freq: Two times a day (BID) | ORAL | 0 refills | Status: DC | PRN

## 2020-01-06 MED ORDER — IBUPROFEN 600 MG PO TABS: 600.0000 mg | ORAL_TABLET | Freq: Three times a day (TID) | ORAL | 0 refills | Status: AC | PRN
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
## 2020-01-26 ENCOUNTER — Encounter: Payer: Self-pay | Admitting: Student in an Organized Health Care Education/Training Program

## 2020-01-26 ENCOUNTER — Other Ambulatory Visit: Payer: Self-pay | Admitting: *Deleted

## 2020-01-26 DIAGNOSIS — I10 Essential (primary) hypertension: Secondary | ICD-10-CM

## 2020-01-26 DIAGNOSIS — M542 Cervicalgia: Secondary | ICD-10-CM

## 2020-01-26 MED ORDER — LISINOPRIL-HYDROCHLOROTHIAZIDE 20-12.5 MG PO TABS: 2.0000 | ORAL_TABLET | Freq: Every day | ORAL | 0 refills | Status: DC

## 2020-01-27 ENCOUNTER — Telehealth: Payer: Self-pay | Admitting: Family Medicine

## 2020-01-27 NOTE — Telephone Encounter (Signed)
Pt.walked in and scheduled an appointment to discuss his medication. He received a message on his ph that he could not understand and wants doctor to call him regarding medication refill.   Ph # [phone]

## 2020-01-27 NOTE — Telephone Encounter (Signed)
Contacted pt to ask about the below message and he said it was about his Lisinopril-HCTZ and I informed him that it was sent in yesterday evening but that he should keep his appointment in case she needs to send in any refills.Lorrin Nawrot [name], CMA

## 2020-02-02 ENCOUNTER — Ambulatory Visit: Payer: Self-pay | Admitting: Family Medicine

## 2020-02-03 ENCOUNTER — Other Ambulatory Visit: Payer: Self-pay

## 2020-02-03 ENCOUNTER — Ambulatory Visit (INDEPENDENT_AMBULATORY_CARE_PROVIDER_SITE_OTHER): Payer: Self-pay | Admitting: Family Medicine

## 2020-02-03 ENCOUNTER — Encounter: Payer: Self-pay | Admitting: Family Medicine

## 2020-02-03 VITALS — BP 108/60 | HR 97 | Ht 70.0 in | Wt 273.6 lb

## 2020-02-03 DIAGNOSIS — E7800 Pure hypercholesterolemia, unspecified: Secondary | ICD-10-CM

## 2020-02-03 DIAGNOSIS — Z0000 Encounter for general adult medical examination without abnormal findings: Secondary | ICD-10-CM

## 2020-02-03 DIAGNOSIS — I10 Essential (primary) hypertension: Secondary | ICD-10-CM

## 2020-02-03 DIAGNOSIS — E119 Type 2 diabetes mellitus without complications: Secondary | ICD-10-CM

## 2020-02-03 LAB — POCT GLYCOSYLATED HEMOGLOBIN (HGB A1C): Hemoglobin A1C: 6.7 % — AB (ref 4.0–5.6)

## 2020-02-03 MED ORDER — ATORVASTATIN CALCIUM 40 MG PO TABS: 40.0000 mg | ORAL_TABLET | Freq: Every day | ORAL | 2 refills | Status: DC

## 2020-02-03 MED ORDER — LISINOPRIL-HYDROCHLOROTHIAZIDE 20-12.5 MG PO TABS: 2.0000 | ORAL_TABLET | Freq: Every day | ORAL | 0 refills | Status: DC

## 2020-02-03 NOTE — Progress Notes (Signed)
   SUBJECTIVE:   CHIEF COMPLAINT / HPI:   Chief Complaint  Patient presents with  . Medication Refill     Adam Caldwell is a 65 y.o. male here for medication refill.  Diabetes Mellitus  Eats 2 meals a day. Denies snacking.  Common foods: Cornflakes, oatmeal, milk, yogurt, mixed veggies, broccoli, rice, lots of fish. Denies missing any doses of DM medications and takes . Denies increased thirst, hunger, or frequent urination. Does not check blood sugars regularly. Last time he checked was in September which was 110. Denies hypoglycemic events.   HTN Takes Lisinopril/HCTZ. Denies missing doses of antihypertensive medications. Denies chest pain, palpitations, lower extremity edema, exertional dyspnea, lightheadedness, headaches and vision changes.  Reports around thanksgiving his SBP was 120s.  Does not check BP regularly.    PERTINENT  PMH / PSH: reviewed and updated as appropriate   OBJECTIVE:   BP 108/60   Pulse 97   Ht 5\' 10"  (1.778 m)   Wt 273 lb 9.6 oz (124.1 kg)   SpO2 99%   BMI 39.26 kg/m    GEN: pleasant older male, in no acute distress  CV: regular rate and rhythm, no murmurs appreciated  RESP: no increased work of breathing, clear to ascultation bilaterally  ABD: Obese abdomen MSK: no lower extremity edema, or calf tenderness  SKIN: warm, dry    ASSESSMENT/PLAN:   HYPERTENSION, BENIGN SYSTEMIC Stable.  BP at goal.  Continue losartan HCTZ.  Refill sent to pharmacy.  Reviewed most recent creatinine and electrolytes.  Well controlled type 2 diabetes mellitus (HCC) A1c 6.7 from 6.5 previously.  Well-controlled on Metformin.  No recent hyperglycemic or hypoglycemic events.  Reviewed recent BMP and lipid panels.  Continue Lipitor and lisinopril as well.  Advised patient to get an eye exam.  Follow-up with PCP in 3 to 6 months.  Health care maintenance patient states he got his Covid booster before Thanksgiving 01-07-1987.  Fecal immunochemistry testing [facility] November [facility].  His results have not come back yet.  His colonoscopy secondary to no insurance.      2725, DO PGY-2, [facility] 02/04/2020

## 2020-02-03 NOTE — Patient Instructions (Signed)
It was great seeing you today!  Please check-out at the front desk before leaving the clinic. I'd like to see you back in 6 months but if you need to be seen earlier than that for any new issues we're happy to fit you in, just give [place] a call!  Visit Remembers: - Stop by the pharmacy to pick up your prescriptions  - Continue to work on your healthy eating habits and incorporating exercise into your daily life. (see below) - Your goal is to have an A1c < 7. - Medicine Changes:    Diet Recommendations for Diabetes  Carbohydrate includes starch, sugar, and fiber.  Of these, only sugar and starch raise blood glucose.  (Fiber is found in fruits, vegetables [especially skin, seeds, and stalks] and whole grains.)   Starchy (carb) foods: Bread, rice, pasta, potatoes, corn, cereal, grits, crackers, bagels, muffins, all baked goods.  (Fruit, milk, and yogurt also have carbohydrate, but most of these foods will not spike your blood sugar as most starchy foods will.)  A few fruits do cause high blood sugars; use small portions of bananas (limit to 1/2 at a time), grapes, watermelon, oranges, and most tropical fruits.   Protein foods: Meat, fish, poultry, eggs, dairy foods, and beans such as pinto and kidney beans (beans also provide carbohydrate).   1. Eat at least REAL 3 meals and 1-2 snacks per day. Never go more than 4-5 hours while awake without eating. Eat breakfast within the first hour of getting up.   2. Limit starchy foods to TWO per meal and ONE per snack. ONE portion of a starchy food is equal to the following:   - ONE slice of bread (or its equivalent, such as half of a hamburger bun).   - 1/2 cup of a "scoopable" starchy food such as potatoes or rice.   - 15 grams of Total Carbohydrate as shown on food label.   - Every 4 ounces of a sweet drink (including fruit juice). 3. Include at every meal: a protein food, a carb food, and vegetables and/or fruit.   - Obtain twice the volume of veg's as  protein or carbohydrate foods for both lunch and dinner.   - Fresh or frozen veg's are best.   - Keep frozen veg's on hand for a quick vegetable serving.       Regarding lab work today:  Due to recent changes in healthcare laws, you may see the results of your imaging and laboratory studies on MyChart before your doctor has had a chance to review them.  We understand that in some cases there may be results that are confusing or concerning to you. Not all laboratory results come back in the same time frame and your doctor may be waiting for multiple results in order to interpret others.  Please give [place] 72 hours in order for your doctor to thoroughly review all the results before contacting the office for clarification of your results. If everything is normal, you will get a letter in the mail or a message in My Chart. Please give [place] a call if you do not hear from [place] after 2 weeks.  Please bring all of your medications with you to each visit.    If you haven't already, sign up for My Chart to have easy access to your labs results, and communication with your primary care physician.  Feel free to call with any questions or concerns at any time, at [phone].   Take care,  Dr. Rushie [facility]

## 2020-02-04 NOTE — Assessment & Plan Note (Signed)
patient states he got his Covid booster before Thanksgiving Karin Golden.  Fecal immunochemistry testing [facility] November 4132.  His results have not come back yet.  His colonoscopy secondary to no insurance.

## 2020-02-04 NOTE — Assessment & Plan Note (Signed)
Stable.  BP at goal.  Continue losartan HCTZ.  Refill sent to pharmacy.  Reviewed most recent creatinine and electrolytes.

## 2020-02-04 NOTE — Assessment & Plan Note (Addendum)
A1c 6.7 from 6.5 previously.  Well-controlled on Metformin.  No recent hyperglycemic or hypoglycemic events.  Reviewed recent BMP and lipid panels.  Continue Lipitor and lisinopril as well.  Advised patient to get an eye exam.  Follow-up with PCP in 3 to 6 months.

## 2020-02-06 ENCOUNTER — Telehealth: Payer: Self-pay

## 2020-02-06 NOTE — Telephone Encounter (Signed)
Patient informed negative FIT Test results.Patient verbalized understanding. At patient's request, copy mailed to patient, and faxed to Dr. Rachael Darby.

## 2020-02-23 ENCOUNTER — Other Ambulatory Visit: Payer: Self-pay

## 2020-02-25 ENCOUNTER — Other Ambulatory Visit: Payer: Self-pay | Admitting: Family Medicine

## 2020-02-25 DIAGNOSIS — M542 Cervicalgia: Secondary | ICD-10-CM

## 2020-06-01 ENCOUNTER — Encounter: Payer: Self-pay | Admitting: Family Medicine

## 2020-06-01 ENCOUNTER — Telehealth: Payer: Self-pay | Admitting: Physician Assistant

## 2020-06-01 DIAGNOSIS — J069 Acute upper respiratory infection, unspecified: Secondary | ICD-10-CM

## 2020-06-01 NOTE — Progress Notes (Signed)
Based on what you shared with me, I feel your condition warrants further evaluation and I recommend that you be seen in a face to face office visit.  Neck stiffness with fevers and other symptoms is concerning for possible meningitis.  Diabetes increases this risk. You need a physical evaluation to rule this out and determine the best course of action for treating your symptoms.   NOTE: If you entered your credit card information for this eVisit, you will not be charged. You may see a "hold" on your card for the $35 but that hold will drop off and you will not have a charge processed.   If you are having a true medical emergency please call 911.      For an urgent face to face visit, [place] has six urgent care centers for your convenience:     [facility] at [facility] Directions [phone] [address] [place], [place] [address] . 8 am - 4 pm Monday - Friday    [facility]) Get Driving Directions [phone] [address] [place], [place] [address] . 8 am to 8 pm Monday-Friday . 10 am to 6 pm [facility] Urgent [facility]) Get Driving Directions [phone]  [address] [place],  [place]  [address] . 8 am to 8 pm Monday-Friday . 8 am to 4 pm [facility] Urgent Care at [facility] Get Driving Directions [phone] [address] [place], [place] [address] . 8 am to 8 pm Monday-Friday . 8 am to 4 pm [facility] Urgent Care at [facility] Get Driving Directions  [phone] [address] [place], [place] [address] . 8 am to 8 pm Monday-Friday . 8 am to 4 pm [facility] Urgent Care at [facility] Directions [phone] [address], [address] [place], [place] [address] . 8 am to 8 pm Monday-Friday . 8 am to 4 pm  Saturday-Sunday     Your MyChart E-visit questionnaire answers were reviewed by a board certified advanced clinical practitioner to complete your personal care plan based on your specific symptoms.  Thank you for using e-Visits.    Greater than 5 minutes, yet less than 10 minutes of time have been spent researching, coordinating, and implementing care for this patient today

## 2020-06-02 ENCOUNTER — Ambulatory Visit: Payer: Self-pay

## 2020-08-31 ENCOUNTER — Ambulatory Visit: Payer: Managed Care, Other (non HMO) | Admitting: Family Medicine

## 2020-09-14 ENCOUNTER — Ambulatory Visit (INDEPENDENT_AMBULATORY_CARE_PROVIDER_SITE_OTHER): Payer: No Typology Code available for payment source

## 2020-09-14 ENCOUNTER — Other Ambulatory Visit: Payer: Self-pay

## 2020-09-14 ENCOUNTER — Encounter: Payer: Self-pay | Admitting: Family Medicine

## 2020-09-14 ENCOUNTER — Ambulatory Visit (INDEPENDENT_AMBULATORY_CARE_PROVIDER_SITE_OTHER): Payer: No Typology Code available for payment source | Admitting: Family Medicine

## 2020-09-14 VITALS — BP 119/68 | HR 92 | Wt 256.4 lb

## 2020-09-14 DIAGNOSIS — Z23 Encounter for immunization: Secondary | ICD-10-CM

## 2020-09-14 DIAGNOSIS — M25511 Pain in right shoulder: Secondary | ICD-10-CM

## 2020-09-14 DIAGNOSIS — Z1211 Encounter for screening for malignant neoplasm of colon: Secondary | ICD-10-CM | POA: Diagnosis not present

## 2020-09-14 DIAGNOSIS — I10 Essential (primary) hypertension: Secondary | ICD-10-CM

## 2020-09-14 DIAGNOSIS — E119 Type 2 diabetes mellitus without complications: Secondary | ICD-10-CM | POA: Diagnosis not present

## 2020-09-14 DIAGNOSIS — R808 Other proteinuria: Secondary | ICD-10-CM

## 2020-09-14 DIAGNOSIS — E7800 Pure hypercholesterolemia, unspecified: Secondary | ICD-10-CM

## 2020-09-14 DIAGNOSIS — Z0000 Encounter for general adult medical examination without abnormal findings: Secondary | ICD-10-CM

## 2020-09-14 LAB — POCT GLYCOSYLATED HEMOGLOBIN (HGB A1C): Hemoglobin A1C: 6.3 % — AB (ref 4.0–5.6)

## 2020-09-14 MED ORDER — LISINOPRIL-HYDROCHLOROTHIAZIDE 20-12.5 MG PO TABS: 2.0000 | ORAL_TABLET | Freq: Every day | ORAL | 0 refills | Status: DC

## 2020-09-14 MED ORDER — ATORVASTATIN CALCIUM 40 MG PO TABS: 40.0000 mg | ORAL_TABLET | Freq: Every day | ORAL | 2 refills | Status: DC

## 2020-09-14 MED ORDER — METFORMIN HCL 1000 MG PO TABS: 1000.0000 mg | ORAL_TABLET | Freq: Two times a day (BID) | ORAL | 3 refills | Status: DC

## 2020-09-14 NOTE — Patient Instructions (Signed)
It was wonderful to see you today. Thank you for allowing me to be a part of your care. Below is a short summary of what we discussed at your visit today:  Right shoulder pain -I have referred you to sports medicine for an evaluation - If you do not hear from the sports medicine clinic within about a week, let us know  Vaccines COVID: Today you received the COVID booster.  You may experience some residual soreness at the injection site.  Gentle stretches and regular use of that arm will help speed up your recovery.  As the vaccines are giving your immune system a "practice run" against specific infections, you may feel a little under the weather.   Shingles: I sent a prescription for the shingles vaccine to your pharmacy.  Because of insurance requirements, your pharmacist will administer this vaccine.  Blood work -Today we checked your blood electrolytes and your cholesterol.If the results are normal, I will send you a letter or MyChart message. If the results are abnormal, I will give you a call.    Colonoscopy - I have referred you to the GI office for a colonoscopy - If you do not hear directly from the GI clinic to schedule an appointment within about a week or so, let us know   Please bring all of your medications to every appointment!  If you have any questions or concerns, please do not hesitate to contact us via phone or MyChart message.   Fayette Pho, MD

## 2020-09-15 DIAGNOSIS — M25511 Pain in right shoulder: Secondary | ICD-10-CM | POA: Insufficient documentation

## 2020-09-15 LAB — COMPREHENSIVE METABOLIC PANEL
ALT: 22 IU/L (ref 0–44)
AST: 28 IU/L (ref 0–40)
Albumin/Globulin Ratio: 1.5 (ref 1.2–2.2)
Albumin: 4.5 g/dL (ref 3.8–4.8)
Alkaline Phosphatase: 78 IU/L (ref 44–121)
BUN/Creatinine Ratio: 15 (ref 10–24)
BUN: 14 mg/dL (ref 8–27)
Bilirubin Total: 0.5 mg/dL (ref 0.0–1.2)
CO2: 27 mmol/L (ref 20–29)
Calcium: 10 mg/dL (ref 8.6–10.2)
Chloride: 101 mmol/L (ref 96–106)
Creatinine, Ser: 0.95 mg/dL (ref 0.76–1.27)
Globulin, Total: 3.1 g/dL (ref 1.5–4.5)
Glucose: 117 mg/dL — ABNORMAL HIGH (ref 65–99)
Potassium: 4.8 mmol/L (ref 3.5–5.2)
Sodium: 141 mmol/L (ref 134–144)
Total Protein: 7.6 g/dL (ref 6.0–8.5)
eGFR: 90 mL/min/{1.73_m2} (ref >59)

## 2020-09-15 LAB — LIPID PANEL
Chol/HDL Ratio: 2.4 ratio (ref 0.0–5.0)
Cholesterol, Total: 120 mg/dL (ref 100–199)
HDL: 49 mg/dL (ref >39)
LDL Chol Calc (NIH): 55 mg/dL (ref 0–99)
Triglycerides: 84 mg/dL (ref 0–149)
VLDL Cholesterol Cal: 16 mg/dL (ref 5–40)

## 2020-09-15 MED ORDER — ZOSTER VAC RECOMB ADJUVANTED 50 MCG/0.5ML IM SUSR: 0.5000 mL | Freq: Once | INTRAMUSCULAR | 0 refills | Status: AC

## 2020-09-15 NOTE — Assessment & Plan Note (Signed)
Last lipid panel 10/10/2019.  Good tolerance to atorvastatin 40 mg.  Will order fasting lipid panel today to calculate updated ASCVD risk score.

## 2020-09-15 NOTE — Assessment & Plan Note (Signed)
BP today at goal, 119/68.  Currently on lisinopril-HCTZ 20-12.5 mg daily.  Last BMP 10/10/2019, potassium and creatinine normal at that time.  Will order BMP today.

## 2020-09-15 NOTE — Assessment & Plan Note (Signed)
-   Second COVID booster today - Referral to GI for colonoscopy - Referral to ophthalmology for routine diabetic eye exam - Prescription for Shingrix vaccine sent to pharmacy

## 2020-09-15 NOTE — Progress Notes (Signed)
    SUBJECTIVE:   CHIEF COMPLAINT / HPI:   Mr. Adam Caldwell is a pleasant 65 yo man coming into clinic today for a physical. His only complaint is intermittent right neck and shoulder pain.   Right neck and shoulder pain - several months - intermittent - radiates from neck to lateral shoulder - patient points with one finger to right AC joint  - no affect on ROM or strength - no numbness/tingling  BP - current regimen lisinopril-HCZ 20-12.5 mg daily - BP today 119/68 - doing well, no complaints of orthostatic dizziness - last BMP 10/10/2019, potassium 4.0 at that time, Cr 1.08  Diabetes - A1c today 6.3 - last A1c 6.7 - current regimen metformin 1000 mg BID - reports doing very well with diet and exercise  HLD - current regimen atorvastatin 40 mg  - last lipid panel 10/10/19 with total cholesterol 119, LDL 55, HDL 44 - good tolerance to statin, no complaints of myalgias  PERTINENT  PMH / PSH: HTN, T2DM, HLD, right plantar fasciitis  OBJECTIVE:   BP 119/68   Pulse 92   Wt 256 lb 6.4 oz (116.3 kg)   SpO2 100%   BMI 36.79 kg/m    PHQ-9:  Depression screen [facility] 2/9 09/14/2020 10/10/2019 10/21/2018  Decreased Interest 0 1 0  Down, Depressed, Hopeless 0 1 0  PHQ - 2 Score 0 2 0  Altered sleeping 1 1 -  Tired, decreased energy 0 0 -  Change in appetite 0 0 -  Feeling bad or failure about yourself  0 0 -  Trouble concentrating 0 0 -  Moving slowly or fidgety/restless 0 0 -  Suicidal thoughts 0 0 -  PHQ-9 Score 1 3 -  Difficult doing work/chores Somewhat difficult - -    GAD-7: No flowsheet data found.   Physical Exam General: Awake, alert, oriented Cardiovascular: Regular rate and rhythm, S1 and S2 present, no murmurs auscultated Respiratory: Lung fields clear to auscultation bilaterally Extremities: No bilateral lower extremity edema, palpable pedal and pretibial pulses bilaterally, TTP over right superior trapezius, full ROM, strength 5/5 and equal bilaterally of  BUE Neuro: Cranial nerves II through X grossly intact, able to move all extremities spontaneously  ASSESSMENT/PLAN:   Right shoulder pain Subacute, intermittent. Most likely MSK in nature, possible overuse with muscle strain. Doubt cervical root impingement given intermittent episodes with no numbness or tingling. Unlikely to be rotator cuff injury given full passive and active ROM, intact strength, and radiation of pain from neck to shoulder.  - tylenol, ibuprofen, ice, heat PRN - sports med referral  Well controlled type 2 diabetes mellitus (HCC) A1c today 6.3 down from 6.7 last. Currently doing well with metformin 1000 mg twice daily and improved diet and exercise.  No changes at this time.  HYPERCHOLESTEROLEMIA Last lipid panel 10/10/2019.  Good tolerance to atorvastatin 40 mg.  Will order fasting lipid panel today to calculate updated ASCVD risk score.  HYPERTENSION, BENIGN SYSTEMIC BP today at goal, 119/68.  Currently on lisinopril-HCTZ 20-12.5 mg daily.  Last BMP 10/10/2019, potassium and creatinine normal at that time.  Will order BMP today.  Health care maintenance - Second COVID booster today - Referral to GI for colonoscopy - Referral to ophthalmology for routine diabetic eye exam - Prescription for Shingrix vaccine sent to pharmacy     Fayette Pho, MD [facility]

## 2020-09-15 NOTE — Assessment & Plan Note (Signed)
Subacute, intermittent. Most likely MSK in nature, possible overuse with muscle strain. Doubt cervical root impingement given intermittent episodes with no numbness or tingling. Unlikely to be rotator cuff injury given full passive and active ROM, intact strength, and radiation of pain from neck to shoulder.  - tylenol, ibuprofen, ice, heat PRN - sports med referral

## 2020-09-15 NOTE — Assessment & Plan Note (Signed)
A1c today 6.3 down from 6.7 last. Currently doing well with metformin 1000 mg twice daily and improved diet and exercise.  No changes at this time.

## 2020-12-02 ENCOUNTER — Other Ambulatory Visit: Payer: Self-pay

## 2020-12-02 ENCOUNTER — Ambulatory Visit (INDEPENDENT_AMBULATORY_CARE_PROVIDER_SITE_OTHER): Payer: No Typology Code available for payment source

## 2020-12-02 DIAGNOSIS — Z23 Encounter for immunization: Secondary | ICD-10-CM | POA: Diagnosis not present

## 2020-12-06 ENCOUNTER — Other Ambulatory Visit: Payer: Self-pay

## 2020-12-06 NOTE — Progress Notes (Signed)
Patient presents to nurse clinic for flu vaccination and COVID bivalent booster. Patient tolerated well.   Veronda Prude, RN

## 2020-12-12 ENCOUNTER — Other Ambulatory Visit: Payer: Self-pay | Admitting: Family Medicine

## 2020-12-12 DIAGNOSIS — I10 Essential (primary) hypertension: Secondary | ICD-10-CM

## 2020-12-14 ENCOUNTER — Other Ambulatory Visit: Payer: Self-pay | Admitting: Family Medicine

## 2020-12-14 DIAGNOSIS — I10 Essential (primary) hypertension: Secondary | ICD-10-CM

## 2020-12-28 ENCOUNTER — Other Ambulatory Visit: Payer: Self-pay | Admitting: Family Medicine

## 2020-12-28 DIAGNOSIS — M542 Cervicalgia: Secondary | ICD-10-CM

## 2021-03-25 ENCOUNTER — Other Ambulatory Visit: Payer: Self-pay | Admitting: Family Medicine

## 2021-03-25 DIAGNOSIS — I10 Essential (primary) hypertension: Secondary | ICD-10-CM

## 2021-04-03 ENCOUNTER — Other Ambulatory Visit: Payer: Self-pay | Admitting: Family Medicine

## 2021-04-03 DIAGNOSIS — I10 Essential (primary) hypertension: Secondary | ICD-10-CM

## 2021-06-23 ENCOUNTER — Other Ambulatory Visit: Payer: Self-pay | Admitting: Family Medicine

## 2021-06-23 DIAGNOSIS — E7800 Pure hypercholesterolemia, unspecified: Secondary | ICD-10-CM

## 2021-07-05 ENCOUNTER — Encounter: Payer: Self-pay | Admitting: *Deleted

## 2021-08-12 ENCOUNTER — Encounter: Payer: Self-pay | Admitting: Family Medicine

## 2021-08-12 ENCOUNTER — Ambulatory Visit (INDEPENDENT_AMBULATORY_CARE_PROVIDER_SITE_OTHER): Payer: PRIVATE HEALTH INSURANCE | Admitting: Family Medicine

## 2021-08-12 VITALS — BP 116/61 | HR 95 | Wt 254.0 lb

## 2021-08-12 DIAGNOSIS — Z1211 Encounter for screening for malignant neoplasm of colon: Secondary | ICD-10-CM | POA: Diagnosis not present

## 2021-08-12 DIAGNOSIS — G479 Sleep disorder, unspecified: Secondary | ICD-10-CM | POA: Diagnosis not present

## 2021-08-12 DIAGNOSIS — E119 Type 2 diabetes mellitus without complications: Secondary | ICD-10-CM

## 2021-08-12 DIAGNOSIS — Z0000 Encounter for general adult medical examination without abnormal findings: Secondary | ICD-10-CM | POA: Diagnosis not present

## 2021-08-12 LAB — POCT GLYCOSYLATED HEMOGLOBIN (HGB A1C): HbA1c, POC (controlled diabetic range): 6.4 % (ref 0.0–7.0)

## 2021-08-12 MED ORDER — SHINGRIX 50 MCG/0.5ML IM SUSR: 0.5000 mL | Freq: Once | INTRAMUSCULAR | 0 refills | Status: AC

## 2021-08-12 NOTE — Assessment & Plan Note (Signed)
Exam normal. HTN, T2DM well-controlled. Hgb A1c 6.4 today. 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

## 2021-08-12 NOTE — Patient Instructions (Addendum)
Thank you for coming in today! It was nice to meet you. Here is what we talked about:  Keep taking your medications, as you have been doing a great job! Your A1c was 6.4, and your blood pressure looked great! We will get a lipid panel today, and I will follow up those results. Try some melatonin around 5 mg or less at least 30 minutes before bedtime to see if you sleep better. Take the Shingrix vaccine copy to your pharmacy. Call the attached number to get your colonoscopy.  Janeal Holmes, MD [facility]

## 2021-08-12 NOTE — Assessment & Plan Note (Signed)
Unable to sleep well throughout night for last 9 years. 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 -Will try melatonin 5 mg or less at least 30 minutes before bed -Discussed sleep hygiene and ensuring little light exposure before bedtime -Reassess at follow up

## 2021-08-12 NOTE — Progress Notes (Signed)
    SUBJECTIVE:   Chief compliant/HPI: annual examination  Adam Caldwell is a 66 y.o. who presents today for an annual exam.   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   OBJECTIVE:   BP 116/61   Pulse 95   Wt 254 lb (115.2 kg)   SpO2 100%   BMI 36.45 kg/m   Physical Exam Constitutional:      General: He is not in acute distress.    Appearance: Normal appearance. He is not ill-appearing.  HENT:     Head: Normocephalic and atraumatic.     Mouth/Throat:     Mouth: Mucous membranes are moist.  Eyes:     General: No scleral icterus.    Extraocular Movements: Extraocular movements intact.  Cardiovascular:     Rate and Rhythm: Normal rate and regular rhythm.     Heart sounds: No murmur heard.    No friction rub. No gallop.  Pulmonary:     Effort: Pulmonary effort is normal. No respiratory distress.     Breath sounds: Normal breath sounds. No wheezing, rhonchi or rales.  Abdominal:     General: Bowel sounds are normal. There is no distension.     Palpations: Abdomen is soft.     Tenderness: There is no abdominal tenderness.  Musculoskeletal:        General: Normal range of motion.     Cervical back: Normal range of motion.  Skin:    General: Skin is warm and dry.  Neurological:     General: No focal deficit present.     Mental Status:  He is alert and oriented to person, place, and time.  Psychiatric:        Mood and Affect: Mood normal.        Behavior: Behavior normal.      ASSESSMENT/PLAN:   Sleep difficulties Unable to sleep well throughout night for last 9 years. 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 -Will try melatonin 5 mg or less at least 30 minutes before bed -Discussed sleep hygiene and ensuring little light exposure before bedtime -Reassess at follow up  Health care maintenance Exam normal. HTN, T2DM well-controlled. Hgb A1c 6.4 today. 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   Janeal Holmes, MD [facility]

## 2021-08-13 LAB — LIPID PANEL
Chol/HDL Ratio: 2.6 ratio (ref 0.0–5.0)
Cholesterol, Total: 113 mg/dL (ref 100–199)
HDL: 43 mg/dL (ref >39)
LDL Chol Calc (NIH): 54 mg/dL (ref 0–99)
Triglycerides: 79 mg/dL (ref 0–149)
VLDL Cholesterol Cal: 16 mg/dL (ref 5–40)

## 2021-08-13 NOTE — Addendum Note (Signed)
Addended by: Janeal Holmes on: 08/13/2021 01:29 PM   Modules accepted: Orders

## 2021-10-09 ENCOUNTER — Other Ambulatory Visit: Payer: Self-pay | Admitting: Family Medicine

## 2021-10-09 DIAGNOSIS — E119 Type 2 diabetes mellitus without complications: Secondary | ICD-10-CM

## 2021-11-24 ENCOUNTER — Ambulatory Visit (INDEPENDENT_AMBULATORY_CARE_PROVIDER_SITE_OTHER): Payer: PRIVATE HEALTH INSURANCE

## 2021-11-24 DIAGNOSIS — Z23 Encounter for immunization: Secondary | ICD-10-CM | POA: Diagnosis not present

## 2022-01-09 ENCOUNTER — Other Ambulatory Visit: Payer: Self-pay

## 2022-01-09 DIAGNOSIS — I10 Essential (primary) hypertension: Secondary | ICD-10-CM

## 2022-01-09 MED ORDER — LISINOPRIL-HYDROCHLOROTHIAZIDE 20-12.5 MG PO TABS: 2.0000 | ORAL_TABLET | Freq: Every day | ORAL | 3 refills | Status: DC

## 2022-04-25 ENCOUNTER — Other Ambulatory Visit: Payer: Self-pay | Admitting: Family Medicine

## 2022-04-25 DIAGNOSIS — E7800 Pure hypercholesterolemia, unspecified: Secondary | ICD-10-CM
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
## 2022-08-16 ENCOUNTER — Other Ambulatory Visit: Payer: Self-pay

## 2022-08-16 ENCOUNTER — Ambulatory Visit (INDEPENDENT_AMBULATORY_CARE_PROVIDER_SITE_OTHER): Payer: PRIVATE HEALTH INSURANCE | Admitting: Family Medicine

## 2022-08-16 ENCOUNTER — Encounter: Payer: Self-pay | Admitting: Family Medicine

## 2022-08-16 VITALS — BP 121/62 | HR 86 | Ht 70.0 in | Wt 266.2 lb

## 2022-08-16 DIAGNOSIS — Z0000 Encounter for general adult medical examination without abnormal findings: Secondary | ICD-10-CM | POA: Diagnosis not present

## 2022-08-16 DIAGNOSIS — E7800 Pure hypercholesterolemia, unspecified: Secondary | ICD-10-CM | POA: Diagnosis not present

## 2022-08-16 DIAGNOSIS — Z1211 Encounter for screening for malignant neoplasm of colon: Secondary | ICD-10-CM

## 2022-08-16 DIAGNOSIS — E119 Type 2 diabetes mellitus without complications: Secondary | ICD-10-CM

## 2022-08-16 DIAGNOSIS — I10 Essential (primary) hypertension: Secondary | ICD-10-CM

## 2022-08-16 DIAGNOSIS — Z23 Encounter for immunization: Secondary | ICD-10-CM | POA: Diagnosis not present

## 2022-08-16 DIAGNOSIS — Z125 Encounter for screening for malignant neoplasm of prostate: Secondary | ICD-10-CM

## 2022-08-16 HISTORY — DX: Encounter for general adult medical examination without abnormal findings: Z00.00

## 2022-08-16 LAB — POCT GLYCOSYLATED HEMOGLOBIN (HGB A1C): HbA1c, POC (controlled diabetic range): 6.8 % (ref 0.0–7.0)

## 2022-08-16 MED ORDER — ZOSTER VAC RECOMB ADJUVANTED 50 MCG/0.5ML IM SUSR: 0.5000 mL | Freq: Once | INTRAMUSCULAR | 0 refills | Status: AC

## 2022-08-16 NOTE — Assessment & Plan Note (Signed)
-   STD screening: Not interested in screening at this time. - Immunizations: Shingrix ordered; PCV20 administered - Lipid screening: Lipid panel ordered - Colonoscopy: Never done, referral placed - Advanced Directive: Not discussed - Handout given on health maintenance topics

## 2022-08-16 NOTE — Assessment & Plan Note (Signed)
Adherent to atorvastatin 40 mg. - Lipid panel

## 2022-08-16 NOTE — Assessment & Plan Note (Signed)
Patient requests PSA screening.  Discussed risks of unnecessary testing and biopsies.  Through shared decision-making, we decided to order PSA test at this time.

## 2022-08-16 NOTE — Assessment & Plan Note (Addendum)
Well controlled on metformin 1000 mg BID.  A1c trending up a bit, but still 6.8.  Counseled on reducing fruit intake and increasing intensity of 30 minute walks. - Urine microalbumin/creatinine ratio - TSH per patient request - CMP - Patient will schedule diabetic eye exam - Diabetic foot exam conducted and results WNL

## 2022-08-16 NOTE — Assessment & Plan Note (Signed)
Well controlled on lisinopril-hydrochlorothiazide 20-12.5 mg. - CMP

## 2022-08-16 NOTE — Progress Notes (Signed)
SUBJECTIVE:   CHIEF COMPLAINT / HPI:  Adam Caldwell is a 67 y.o. male with a pertinent past medical history of T2DM, HTN, HLD, and obesity presenting to the clinic for annual physical.  Diabetes - Last A1c 6.3 in August 2022 - Home CBGs: Not taking - Medications: Metformin 1000 mg BID - Adherence: Good adherence - Eye exam: Due, will schedule - Foot exam: Due - Microalbumin: Due - Statin: Atorvastatin 40 mg - No symptoms of hypoglycemia, polyuria, polydipsia, numbness extremities, foot ulcers/trauma  Hypertension BP: 121/62 today. Home medications include: Lisinopril-hydrochlorothiazide 20-12.5 mg. He endorses taking these medications as prescribed. Does not check blood pressure at home. Diet: Oatmeal breakfast, rice and vegetables in afternoon, cider vinegar, carrots, African food, fruits Exercise: Walks at least 30 minutes a day. Patient has not had a BMP in the past 1 year.  Most recent creatinine trend:  Lab Results  Component Value Date   CREATININE 0.95 09/14/2020   CREATININE 1.08 10/10/2019   CREATININE 0.91 10/21/2018   PERTINENT PMH / PSH: T2DM, HTN, HLD, and obesity   OBJECTIVE:   BP 121/62   Pulse 86   Ht 5\' 10"  (1.778 m)   Wt 266 lb 3.2 oz (120.7 kg)   SpO2 100%   BMI 38.20 kg/m   General: Age-appropriate, resting comfortably in chair, NAD, alert and at baseline. Cardiovascular: Regular rate and rhythm. Normal S1/S2. No murmurs, rubs, or gallops appreciated. 2+ radial pulses. Pulmonary: Clear bilaterally to ascultation. No increased WOB, no accessory muscle usage. No wheezes, crackles, or rhonchi. Abdominal: No tenderness to deep or light palpation. No rebound or guarding. No HSM. Skin: Warm and dry. Extremities: No peripheral edema bilaterally. Capillary refill <2 seconds. Diabetic foot exam shows intact sensation and discrimination over all toes and plantar surface of feet.  No ulcers or calluses present.  POCT Labs HgB A1c     Status: None    Collection Time: 08/16/22  9:04 AM  Result Value Ref Range   HbA1c, POC (controlled diabetic range) 6.8 0.0 - 7.0 %    ASSESSMENT/PLAN:   Problem List Items Addressed This Visit       Cardiovascular and Mediastinum   HYPERTENSION, BENIGN SYSTEMIC    Well controlled on lisinopril-hydrochlorothiazide 20-12.5 mg. - CMP        Endocrine   Well controlled type 2 diabetes mellitus (HCC)    Well controlled on metformin 1000 mg BID.  A1c trending up a bit, but still 6.8.  Counseled on reducing fruit intake and increasing intensity of 30 minute walks. - Urine microalbumin/creatinine ratio - TSH per patient request - CMP - Patient will schedule diabetic eye exam - Diabetic foot exam conducted and results WNL      Relevant Orders   Microalbumin/Creatinine Ratio, Urine   HgB A1c (Completed)   Comprehensive metabolic panel   TSH     Other   HYPERCHOLESTEROLEMIA    Adherent to atorvastatin 40 mg. - Lipid panel      Relevant Orders   Lipid Panel   Prostate cancer screening    Patient requests PSA screening.  Discussed risks of unnecessary testing and biopsies.  Through shared decision-making, we decided to order PSA test at this time.      Relevant Orders   PSA   Healthcare maintenance    - STD screening: Not interested in screening at this time. - Immunizations: Shingrix ordered; PCV20 administered - Lipid screening: Lipid panel ordered - Colonoscopy: Never done, referral placed - Advanced  Directive: Not discussed - Handout given on health maintenance topics      Other Visit Diagnoses     Physical exam, annual    -  Primary   Need for vaccination against Streptococcus pneumoniae       Relevant Orders   Pneumococcal conjugate vaccine 20-valent (Prevnar 20) (Completed)   Need for shingles vaccine       Relevant Medications   Zoster Vaccine Adjuvanted [facility]) injection   Colon cancer screening       Relevant Orders   Ambulatory referral to Gastroenterology       Return in about 1 year (around 08/16/2023).  Lodie Waheed Sharion Dove, MD [facility]

## 2022-08-16 NOTE — Patient Instructions (Addendum)
It was great to see you today! Thank you for choosing [facility] for your primary care.  Today we addressed:  Colonoscopy: I have sent a referral and you will get a call from the GI office about scheduling  Eye exam: You are due for an eye exam for diabetes, please schedule one with your ophthalmologist.  Shingles vaccine: You can go get one at the pharmacy anytime. It can make you feel poorly for about a day, so plan for that.  PSA test: I have ordered a PSA screen for you.  Diabetes: Your A1c was 6.8 today.  Keep up your medicines and 30 minute walks.  Reduce the amount of fruits you are eating.  You also got the pneumonia vaccine today.  We are checking some labs today, including CMP, lipid panel, thyroid labs, and kidney urine test.  You should return to our clinic in 1 year or sooner if needed!  Thank you for coming to see [place] at [facility] and for the opportunity to care for you! Jyrah Blye, MD 08/16/2022, 9:38 AM  ____________________________________________________  Make sure to check out at the front desk before you leave today.  Please arrive at least 15 minutes prior to your scheduled appointments.  If you haven't already, please set up MyChart to have easy access to your labs results, and communication with your primary care physician.  If you had blood work today, you will get a MyChart message or a letter if results are normal. Otherwise, you will get a call from [place].  If you had a referral placed, they will call you to set up an appointment. Please give [place] a call if you don't hear back in the next 2 weeks.  If you need additional refills before your next appointment, please call your pharmacy first.   Health Maintenance, Male Adopting a healthy lifestyle and getting preventive care are important in promoting health and wellness. Ask your health care provider about: The right schedule for you to have regular tests and exams. Things you can do  on your own to prevent diseases and keep yourself healthy. What should I know about diet, weight, and exercise? Eat a healthy diet  Eat a diet that includes plenty of vegetables, fruits, low-fat dairy products, and lean protein. Do not eat a lot of foods that are high in solid fats, added sugars, or sodium. Maintain a healthy weight Body mass index (BMI) is a measurement that can be used to identify possible weight problems. It estimates body fat based on height and weight. Your health care provider can help determine your BMI and help you achieve or maintain a healthy weight. Get regular exercise Get regular exercise. This is one of the most important things you can do for your health. Most adults should: Exercise for at least 150 minutes each week. The exercise should increase your heart rate and make you sweat (moderate-intensity exercise). Do strengthening exercises at least twice a week. This is in addition to the moderate-intensity exercise. Spend less time sitting. Even light physical activity can be beneficial. Watch cholesterol and blood lipids Have your blood tested for lipids and cholesterol at 66 years of age, then have this test every 5 years. You may need to have your cholesterol levels checked more often if: Your lipid or cholesterol levels are high. You are older than 66 years of age. You are at high risk for heart disease. What should I know about cancer screening? Many types of cancers can  be detected early and may often be prevented. Depending on your health history and family history, you may need to have cancer screening at various ages. This may include screening for: Colorectal cancer. Prostate cancer. Skin cancer. Lung cancer. What should I know about heart disease, diabetes, and high blood pressure? Blood pressure and heart disease High blood pressure causes heart disease and increases the risk of stroke. This is more likely to develop in people who have high blood  pressure readings or are overweight. Talk with your health care provider about your target blood pressure readings. Have your blood pressure checked: Every 3-5 years if you are 43-53 years of age. Every year if you are 78 years old or older. If you are between the ages of 57 and 34 and are a current or former smoker, ask your health care provider if you should have a one-time screening for abdominal aortic aneurysm (AAA). Diabetes Have regular diabetes screenings. This checks your fasting blood sugar level. Have the screening done: Once every three years after age 21 if you are at a normal weight and have a low risk for diabetes. More often and at a younger age if you are overweight or have a high risk for diabetes. What should I know about preventing infection? Hepatitis B If you have a higher risk for hepatitis B, you should be screened for this virus. Talk with your health care provider to find out if you are at risk for hepatitis B infection. Hepatitis C Blood testing is recommended for: Everyone born from [birth year] through 1964. Anyone with known risk factors for hepatitis C. Sexually transmitted infections (STIs) You should be screened each year for STIs, including gonorrhea and chlamydia, if: You are sexually active and are younger than 66 years of age. You are older than 66 years of age and your health care provider tells you that you are at risk for this type of infection. Your sexual activity has changed since you were last screened, and you are at increased risk for chlamydia or gonorrhea. Ask your health care provider if you are at risk. Ask your health care provider about whether you are at high risk for HIV. Your health care provider may recommend a prescription medicine to help prevent HIV infection. If you choose to take medicine to prevent HIV, you should first get tested for HIV. You should then be tested every 3 months for as long as you are taking the medicine. Follow these  instructions at home: Alcohol use Do not drink alcohol if your health care provider tells you not to drink. If you drink alcohol: Limit how much you have to 0-2 drinks a day. Know how much alcohol is in your drink. In the [place] one drink equals one 12 oz bottle of beer (355 mL), one 5 oz glass of wine (148 mL), or one 1 oz glass of hard liquor (44 mL). Lifestyle Do not use any products that contain nicotine or tobacco. These products include cigarettes, chewing tobacco, and vaping devices, such as e-cigarettes. If you need help quitting, ask your health care provider. Do not use street drugs. Do not share needles. Ask your health care provider for help if you need support or information about quitting drugs. General instructions Schedule regular health, dental, and eye exams. Stay current with your vaccines. Tell your health care provider if: You often feel depressed. You have ever been abused or do not feel safe at home. Summary Adopting a healthy lifestyle and getting preventive care  are important in promoting health and wellness. Follow your health care provider's instructions about healthy diet, exercising, and getting tested or screened for diseases. Follow your health care provider's instructions on monitoring your cholesterol and blood pressure. This information is not intended to replace advice given to you by your health care provider. Make sure you discuss any questions you have with your health care provider. Document Revised: 06/07/2020 Document Reviewed: 06/07/2020 Elsevier Patient Education  2024 [employer].

## 2022-08-17 LAB — COMPREHENSIVE METABOLIC PANEL
ALT: 28 IU/L (ref 0–44)
AST: 23 IU/L (ref 0–40)
Albumin: 4.2 g/dL (ref 3.9–4.9)
Alkaline Phosphatase: 91 IU/L (ref 44–121)
BUN/Creatinine Ratio: 15 (ref 10–24)
BUN: 15 mg/dL (ref 8–27)
Bilirubin Total: 0.4 mg/dL (ref 0.0–1.2)
CO2: 25 mmol/L (ref 20–29)
Calcium: 9.7 mg/dL (ref 8.6–10.2)
Chloride: 99 mmol/L (ref 96–106)
Creatinine, Ser: 1 mg/dL (ref 0.76–1.27)
Globulin, Total: 3.1 g/dL (ref 1.5–4.5)
Glucose: 114 mg/dL — ABNORMAL HIGH (ref 70–99)
Potassium: 3.9 mmol/L (ref 3.5–5.2)
Sodium: 138 mmol/L (ref 134–144)
Total Protein: 7.3 g/dL (ref 6.0–8.5)
eGFR: 84 mL/min/{1.73_m2} (ref >59)

## 2022-08-17 LAB — LIPID PANEL
Chol/HDL Ratio: 2.8 ratio (ref 0.0–5.0)
Cholesterol, Total: 131 mg/dL (ref 100–199)
HDL: 47 mg/dL (ref >39)
LDL Chol Calc (NIH): 65 mg/dL (ref 0–99)
Triglycerides: 100 mg/dL (ref 0–149)
VLDL Cholesterol Cal: 19 mg/dL (ref 5–40)

## 2022-08-17 LAB — PSA: Prostate Specific Ag, Serum: 0.7 ng/mL (ref 0.0–4.0)

## 2022-08-17 LAB — TSH: TSH: 2.87 u[IU]/mL (ref 0.450–4.500)

## 2022-08-17 LAB — MICROALBUMIN / CREATININE URINE RATIO
Creatinine, Urine: 38.2 mg/dL
Microalb/Creat Ratio: 9 mg/g creat (ref 0–29)
Microalbumin, Urine: 3.3 ug/mL

## 2022-10-29 ENCOUNTER — Other Ambulatory Visit: Payer: Self-pay | Admitting: Family Medicine

## 2022-10-29 DIAGNOSIS — E119 Type 2 diabetes mellitus without complications: Secondary | ICD-10-CM

## 2022-11-18 ENCOUNTER — Encounter: Payer: Self-pay | Admitting: Family Medicine

## 2022-11-20 ENCOUNTER — Other Ambulatory Visit: Payer: Self-pay | Admitting: Family Medicine

## 2022-11-20 ENCOUNTER — Encounter: Payer: Self-pay | Admitting: Family Medicine

## 2022-11-20 DIAGNOSIS — Z125 Encounter for screening for malignant neoplasm of prostate: Secondary | ICD-10-CM

## 2022-11-20 DIAGNOSIS — Z1211 Encounter for screening for malignant neoplasm of colon: Secondary | ICD-10-CM

## 2022-11-20 NOTE — Progress Notes (Signed)
Cologuard ordered per patient request. Patient last seen by me 7/17 and referred to GI for colonoscopy.  Cannot see records of GI visit in chart, unclear what discussion was had.

## 2022-11-29 ENCOUNTER — Encounter: Payer: Self-pay | Admitting: Family Medicine

## 2022-12-06 ENCOUNTER — Encounter: Payer: Self-pay | Admitting: Family Medicine

## 2022-12-07 NOTE — Telephone Encounter (Signed)
Called patient and apologized for issues personally.  I ordered lab as "Future" 2 weeks ago, when it should have been normal order.  Unclear who would have ordered Cologuard originally 4 weeks ago, seems like it was GI and patient states the same.  Cannot see those records in Epic, was not from [facility], did not discuss Cologuard at July appointment.  Patient seemed agreeable, appreciated apology, had no questions.

## 2022-12-24 ENCOUNTER — Encounter: Payer: Self-pay | Admitting: Family Medicine

## 2022-12-29 LAB — COLOGUARD: COLOGUARD: NEGATIVE

## 2023-01-31 ENCOUNTER — Other Ambulatory Visit: Payer: Self-pay | Admitting: Family Medicine

## 2023-01-31 DIAGNOSIS — I10 Essential (primary) hypertension: Secondary | ICD-10-CM

## 2023-05-02 ENCOUNTER — Other Ambulatory Visit: Payer: Self-pay | Admitting: Family Medicine

## 2023-05-02 DIAGNOSIS — E7800 Pure hypercholesterolemia, unspecified: Secondary | ICD-10-CM

## 2023-09-07 ENCOUNTER — Encounter: Payer: Self-pay | Admitting: Family Medicine

## 2023-09-07 ENCOUNTER — Ambulatory Visit: Payer: Self-pay | Admitting: Family Medicine

## 2023-09-07 ENCOUNTER — Ambulatory Visit (HOSPITAL_COMMUNITY)
Admission: RE | Admit: 2023-09-07 | Discharge: 2023-09-07 | Disposition: A | Discharge status: Home / Self Care | Payer: PRIVATE HEALTH INSURANCE | Source: Ambulatory Visit | Attending: Family Medicine | Admitting: Family Medicine

## 2023-09-07 ENCOUNTER — Ambulatory Visit (INDEPENDENT_AMBULATORY_CARE_PROVIDER_SITE_OTHER): Payer: PRIVATE HEALTH INSURANCE | Admitting: Family Medicine

## 2023-09-07 VITALS — BP 128/74 | HR 88 | Ht 70.0 in | Wt 265.6 lb

## 2023-09-07 DIAGNOSIS — M19122 Post-traumatic osteoarthritis, left elbow: Secondary | ICD-10-CM | POA: Diagnosis present

## 2023-09-07 DIAGNOSIS — I10 Essential (primary) hypertension: Secondary | ICD-10-CM

## 2023-09-07 DIAGNOSIS — E119 Type 2 diabetes mellitus without complications: Secondary | ICD-10-CM

## 2023-09-07 DIAGNOSIS — Z0000 Encounter for general adult medical examination without abnormal findings: Secondary | ICD-10-CM | POA: Diagnosis not present

## 2023-09-07 DIAGNOSIS — E6609 Other obesity due to excess calories: Secondary | ICD-10-CM

## 2023-09-07 DIAGNOSIS — E66811 Obesity, class 1: Secondary | ICD-10-CM

## 2023-09-07 DIAGNOSIS — Z6831 Body mass index (BMI) 31.0-31.9, adult: Secondary | ICD-10-CM

## 2023-09-07 LAB — POCT GLYCOSYLATED HEMOGLOBIN (HGB A1C): HbA1c, POC (controlled diabetic range): 6.9 % (ref 0.0–7.0)

## 2023-09-07 NOTE — Assessment & Plan Note (Addendum)
 Well controlled, A1c 6.9 today. - Medications: Continue metformin  1000 mg twice daily - Microalbumin/creatinine ratio - BMP - Lipid panel - Recommended discontinuing aspirin

## 2023-09-07 NOTE — Progress Notes (Signed)
 Subjective:  CC -- Annual Physical with complaint of L elbow pain and decreased ROM  Type 2 diabetes mellitus - Last A1c 6.8 on 08/16/2022 - Home CBGs: Not checking - Medications: Metformin  1000 mg BID - Adherence: Good - Eye exam: UTD - Foot exam: Due today - Microalbumin: Due today - Statin: Atorvastatin  40 mg - No symptoms of hypoglycemia, polyuria, polydipsia, numbness of extremities, foot ulcers/trauma  Hypertension - BP: 128/74 today. - Home medications include: lisinopril -hydrochlorothiazide  40-25 mg daily - He endorses taking these medications as prescribed. - Does not check blood pressure at home. - Denies hypotension symptoms including dizziness and orthostatic symptoms. - Denies headache, vision changes, LUQ pain, hematuria, chest pain.  Left elbow pain Patient had crush injury of L elbow in roller at [employer] in early 2000s. Reports that his elbow has been hurting more at night and early in the morning. Pain improves throughout the day, goes away with use. Occasionally takes an ibuprofen , which helps a bit. Has also noticed decrease in ROM.  Most recent creatinine trend: Lab Results  Component Value Date   CREATININE 1.00 08/16/2022   CREATININE 0.95 09/14/2020   CREATININE 1.08 10/10/2019    [facility]: Will calculate ASCVD risk after labs today. Aspirin: Taking for primary prevention, discussed discontinuing but patient wishes to continue Dx Hyperlipidemia: On statin for T2DM  Dx Obesity: yes Physical Activity: Walking at least an hour 7 days a week, he feels like he is breaking a sweat Urinary Incontinence/Retention: no  Social: Driving: no issues Alcohol Use: Rarely, socially  Tobacco Use: Never smoked  Other Drugs: no  Independence: Independent in all ADLs Depression: no  Support and Life at Home: yes Advanced Directives: no, will revisit at future visit   Cancer: Colorectal: Cologuard negative 12/21/2022 Lung >> Tobacco  Use: No indication for lung cancer screening Prostate >> Interested in DRE and/or PSA: PSA WNL on 08/16/2022 Skin >> Suspicious lesions: no   Other: Osteoporosis: No fractures or risk factors for osteopenia AAA Screen: Not indicated Zoster Vaccine: Completed Shingrix  series Pneumonia Vaccine: UTD Flu Vaccine: Not available in clinic today, discussed getting at pharmacy  Objective: BP 128/74   Pulse 88   Ht 5' 10 (1.778 m)   Wt 265 lb 9.6 oz (120.5 kg)   SpO2 100%   BMI 38.11 kg/m   Gen: NAD, alert, resting in chair comfortably. HEENT: NCAT, EOMI, PERRL CV: RRR, good S1/S2, no murmurs/rubs/gallops. Resp: CTABL, no wheezes, non-labored Abd: Soft, Non Tender, Non-distended, normoactive bowel sounds, no guarding or organomegaly. Ext: No edema, warm. Neuro: Alert and oriented, No gross deficits MSK: Passive extension of L elbow limited at ~165 degrees, active extension limited by pain.  No bony defects palpable, no elbow edema or erythema.  Diabetic Foot Exam - Simple   Simple Foot Form Visual Inspection No deformities, no ulcerations, no other skin breakdown bilaterally: Yes Sensation Testing See comments: Yes Pulse Check Posterior Tibialis and Dorsalis pulse intact bilaterally: Yes Comments Monofilament testing showed intact sensation with diminished point discrimination over all toes and plantar surfaces of feet bilaterally.  No ulcers or calluses present.  Nails with mild onychomycosis, but not hypertrophic.     Assessment/Plan: Assessment & Plan Annual physical exam Overall doing well, up-to-date on preventative screenings except diabetes, which were completed today.  Working on exercise and weight loss, varied diet.  Maintaining independence in the community and stay active. - Discussed increasing pace of walking to facilitate moderate daily exercise Post-traumatic osteoarthritis of  left elbow Suspect osteoarthritis given physical exam and history of injury/surgery as  well as stiffness and pain improving throughout the day with use. - XR left elbow, consider surgical referral concerning findings - Ambulatory referral to PT - Voltaren  gel 4 times daily as needed - Caution with ibuprofen  use given GI risks Well controlled type 2 diabetes mellitus (HCC) Well controlled, A1c 6.9 today. - Medications: Continue metformin  1000 mg twice daily - Microalbumin/creatinine ratio - BMP - Lipid panel - Recommended discontinuing aspirin HYPERTENSION, BENIGN SYSTEMIC Well-controlled, no concerns. - Continue lisinopril -hydrochlorothiazide  40-25 mg daily  Lizzet Hendley, MD 09/07/2023 5:48 PM

## 2023-09-07 NOTE — Assessment & Plan Note (Signed)
 Well-controlled, no concerns. - Continue lisinopril -hydrochlorothiazide  40-25 mg daily

## 2023-09-07 NOTE — Patient Instructions (Addendum)
 It was great to see you today! Thank you for choosing [facility] for your primary care.  Today we addressed: Well controlled type 2 diabetes mellitus (HCC) Your A1c is 6.9 today.  Please keep taking your metformin .  Make sure to walk quickly when you go on your daily walks, really try to break a sweat.  Post-traumatic osteoarthritis of left elbow I am referring you to physical therapy, they will call you to set up an appointment. Please give us  a call if you don't hear back in the next 2 weeks. You can also use Voltaren  gel over the counter four times or more per day. I would use the ibuprofen  sparingly, only if really bothering you. You can go get an Xray at [facility] any time.  You can go to Entrance A of the hospital and they will direct you during business hours.  We are checking some labs today, including metabolic panel, cholesterol level, kidney function.  You will get a MyChart message or a letter if results are normal. Otherwise, you will get a call from us .  Thank you for coming to see us  at [facility] and for the opportunity to care for you! Toma, Jules Vidovich, MD 09/07/2023, 2:16 PM

## 2023-09-08 LAB — LIPID PANEL
Chol/HDL Ratio: 2.6 ratio (ref 0.0–5.0)
Cholesterol, Total: 129 mg/dL (ref 100–199)
HDL: 49 mg/dL (ref >39)
LDL Chol Calc (NIH): 64 mg/dL (ref 0–99)
Triglycerides: 85 mg/dL (ref 0–149)
VLDL Cholesterol Cal: 16 mg/dL (ref 5–40)

## 2023-09-08 LAB — BASIC METABOLIC PANEL WITH GFR
BUN/Creatinine Ratio: 13 (ref 10–24)
BUN: 12 mg/dL (ref 8–27)
CO2: 20 mmol/L (ref 20–29)
Calcium: 9.8 mg/dL (ref 8.6–10.2)
Chloride: 98 mmol/L (ref 96–106)
Creatinine, Ser: 0.93 mg/dL (ref 0.76–1.27)
Glucose: 98 mg/dL (ref 70–99)
Potassium: 3.5 mmol/L (ref 3.5–5.2)
Sodium: 140 mmol/L (ref 134–144)
eGFR: 91 mL/min/1.73 (ref >59)

## 2023-09-09 LAB — MICROALBUMIN / CREATININE URINE RATIO
Creatinine, Urine: 57.8 mg/dL
Microalb/Creat Ratio: <5 mg/g{creat} (ref 0–29)
Microalbumin, Urine: <3 ug/mL

## 2023-10-15 ENCOUNTER — Telehealth: Payer: Self-pay | Admitting: Family Medicine

## 2023-10-15 DIAGNOSIS — Z111 Encounter for screening for respiratory tuberculosis: Secondary | ICD-10-CM

## 2023-10-16 NOTE — Telephone Encounter (Signed)
 Reviewed form and placed in PCP's box for completion.  Cena JONELLE Pesa, CMA

## 2023-10-30 ENCOUNTER — Other Ambulatory Visit: Payer: Self-pay

## 2023-10-30 DIAGNOSIS — E119 Type 2 diabetes mellitus without complications: Secondary | ICD-10-CM

## 2023-10-30 MED ORDER — METFORMIN HCL 1000 MG PO TABS: 1000.0000 mg | ORAL_TABLET | Freq: Two times a day (BID) | ORAL | 3 refills | Status: AC

## 2023-11-22 ENCOUNTER — Other Ambulatory Visit: Payer: PRIVATE HEALTH INSURANCE

## 2023-11-22 DIAGNOSIS — Z111 Encounter for screening for respiratory tuberculosis: Secondary | ICD-10-CM

## 2023-11-26 ENCOUNTER — Ambulatory Visit: Payer: Self-pay | Admitting: Family Medicine

## 2023-11-26 LAB — QUANTIFERON-TB GOLD PLUS
QuantiFERON Mitogen Value: >10 [IU]/mL
QuantiFERON Nil Value: 0.14 [IU]/mL
QuantiFERON TB1 Ag Value: 0.16 [IU]/mL
QuantiFERON TB2 Ag Value: 0.1 [IU]/mL

## 2023-11-27 NOTE — Telephone Encounter (Signed)
 Patient called, LVM and informed that forms are ready for pick up. Copy made and placed in batch scanning. Original placed at front desk for pick up.   Veronda Prude, RN

## 2023-12-23 ENCOUNTER — Other Ambulatory Visit: Payer: Self-pay

## 2023-12-23 ENCOUNTER — Ambulatory Visit (HOSPITAL_COMMUNITY)
Admission: EM | Admit: 2023-12-23 | Discharge: 2023-12-23 | Disposition: A | Discharge status: Home / Self Care | Payer: PRIVATE HEALTH INSURANCE

## 2023-12-23 ENCOUNTER — Encounter (HOSPITAL_COMMUNITY): Payer: Self-pay | Admitting: *Deleted

## 2023-12-23 DIAGNOSIS — J069 Acute upper respiratory infection, unspecified: Secondary | ICD-10-CM

## 2023-12-23 NOTE — ED Provider Notes (Signed)
 [facility]    CSN: [id number] Arrival date & time: 12/23/23  0930      History   Chief Complaint Chief Complaint  Patient presents with   Cough   Nasal Congestion    HPI Adam Caldwell is a 68 y.o. male.   Adam Caldwell is a 68 y.o. male presenting for chief complaint of cough, nasal congestion, sore throat, and generalized fatigue that started 7 days ago on Sunday, December 16, 2023.  Cough is mostly dry and nonproductive.  Cough is worse at nighttime and he states he feels some wheezing in his chest at nighttime when he lays down to sleep.  He denies nausea, vomiting, diarrhea, abdominal pain, rashes, fever/chills, shortness of breath, chest pain, leg swelling, and orthopnea.  Never smoker, denies history of asthma or COPD.  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  No sick contacts in the home.  He is taking Mucinex dm and Tylenol/ibuprofen  with relief of symptoms.   Cough   Past Medical History:  Diagnosis Date   Healthcare maintenance 08/16/2022   Hyperlipidemia    Hypertension    Other proteinuria 09/12/2006   Qualifier: Diagnosis of   By: MAURICE  MD, MELISSA         Taking multiple medications for chronic disease 06/11/2017    Patient Active Problem List   Diagnosis Date Noted   Sleep difficulties 08/12/2021   Right shoulder pain 09/15/2020   Neck pain 12/11/2019   Stress at home 10/12/2019   Positive PPD 10/31/2017   Well controlled type 2 diabetes mellitus (HCC) 06/11/2017   Prostate cancer screening 12/03/2014   Plantar fasciitis of right foot 12/03/2014   HYPERCHOLESTEROLEMIA 03/29/2006   OBESITY, NOS 03/29/2006   HYPERTENSION, BENIGN SYSTEMIC 03/29/2006    History reviewed. No pertinent surgical history.     Home Medications    Prior to Admission medications   Medication Sig Start Date End Date Taking? Authorizing Provider  aspirin 81 MG EC tablet  Take 81 mg by mouth daily. Patient preference   Yes [provider]  atorvastatin  (LIPITOR) 40 MG tablet TAKE 1 TABLET BY MOUTH DAILY 05/04/23  Yes Shitarev, Dimitry, MD  Calcium  Carbonate-Vitamin D  500-500 MG-UNIT CHEW Chew by mouth daily. Take 2 tabs   Yes [provider]  ibuprofen  (ADVIL ) 600 MG tablet Take 1 tablet (600 mg total) by mouth every 8 (eight) hours as needed. 01/06/20  Yes Macario Dorothyann HERO, MD  lisinopril -hydrochlorothiazide  (ZESTORETIC ) 20-12.5 MG tablet TAKE 2 TABLETS BY MOUTH DAILY 02/01/23  Yes Everhart, Kirstie, DO  metFORMIN  (GLUCOPHAGE ) 1000 MG tablet Take 1 tablet (1,000 mg total) by mouth 2 (two) times daily with a meal. 10/30/23  Yes Shitarev, Dimitry, MD  Multiple Vitamins-Minerals ([employer] SPECTRAVITE) TABS Take by mouth daily.     Yes [provider]  Blood Glucose Monitoring Suppl (CONTOUR NEXT ONE) KIT 1 application by Does not apply route daily. Patient not taking: Reported on 09/07/2023 02/10/19   Francesco Alan BROCKS, MD  diclofenac  Sodium (VOLTAREN ) 1 % GEL APPLY 2 GRAMS TOPICALLY FOUR TIMES A DAY AS NEEDED Patient not taking: Reported on 09/07/2023 12/28/20   Macario Dorothyann HERO, MD  glucose blood (CONTOUR NEXT TEST) test strip Use once daily Patient not taking: Reported on 09/07/2023 02/10/19   Francesco Alan BROCKS, MD  Lancet Devices (MICROLET NEXT LANCING DEVICE) MISC 1 each by Does not apply  route as needed. Patient not taking: Reported on 09/07/2023 10/21/18   Francesco Alan BROCKS, MD  Microlet Lancets MISC Use as needed Patient not taking: Reported on 09/07/2023 10/21/18   Francesco Alan BROCKS, MD    Family History History reviewed. No pertinent family history.  Social History Social History   Tobacco Use   Smoking status: Never   Smokeless tobacco: Never  Substance Use Topics   Alcohol use: No   Drug use: No     Allergies   Penicillins   Review of Systems Review of Systems  Respiratory:  Positive for cough.   Per HPI   Physical  Exam Triage Vital Signs ED Triage Vitals  Encounter Vitals Group     BP 12/23/23 0956 133/76     Girls Systolic BP Percentile --      Girls Diastolic BP Percentile --      Boys Systolic BP Percentile --      Boys Diastolic BP Percentile --      Pulse Rate 12/23/23 0956 78     Resp 12/23/23 0956 20     Temp 12/23/23 0956 98.8 F (37.1 C)     Temp src --      SpO2 12/23/23 0956 97 %     Weight --      Height --      Head Circumference --      Peak Flow --      Pain Score 12/23/23 0954 9     Pain Loc --      Pain Education --      Exclude from Growth Chart --    No data found.  Updated Vital Signs BP 133/76   Pulse 78   Temp 98.8 F (37.1 C)   Resp 20   SpO2 97%   Visual Acuity Right Eye Distance:   Left Eye Distance:   Bilateral Distance:    Right Eye Near:   Left Eye Near:    Bilateral Near:     Physical Exam Vitals and nursing note reviewed.  Constitutional:      Appearance: He is not ill-appearing or toxic-appearing.  HENT:     Head: Normocephalic and atraumatic.     Right Ear: Hearing, tympanic membrane, ear canal and external ear normal.     Left Ear: Hearing, tympanic membrane, ear canal and external ear normal.     Nose: Congestion present.     Mouth/Throat:     Lips: Pink.     Mouth: Mucous membranes are moist. No injury or oral lesions.     Dentition: Normal dentition.     Tongue: No lesions.     Pharynx: Oropharynx is clear. Uvula midline. No pharyngeal swelling, oropharyngeal exudate, posterior oropharyngeal erythema, uvula swelling or postnasal drip.     Tonsils: No tonsillar exudate.  Eyes:     General: Lids are normal. Vision grossly intact. Gaze aligned appropriately.     Extraocular Movements: Extraocular movements intact.     Conjunctiva/sclera: Conjunctivae normal.  Neck:     Trachea: Trachea and phonation normal.  Cardiovascular:     Rate and Rhythm: Normal rate and regular rhythm.     Heart sounds: Normal heart sounds, S1 normal and  S2 normal.  Pulmonary:     Effort: Pulmonary effort is normal. No respiratory distress.     Breath sounds: Normal breath sounds and air entry.  Musculoskeletal:     Cervical back: Neck supple.     Right lower leg: No edema.  Left lower leg: No edema.  Lymphadenopathy:     Cervical: No cervical adenopathy.  Skin:    General: Skin is warm and dry.     Capillary Refill: Capillary refill takes less than 2 seconds.     Findings: No rash.  Neurological:     General: No focal deficit present.     Mental Status: He is alert and oriented to person, place, and time. Mental status is at baseline.     Cranial Nerves: No dysarthria or facial asymmetry.  Psychiatric:        Mood and Affect: Mood normal.        Speech: Speech normal.        Behavior: Behavior normal.        Thought Content: Thought content normal.        Judgment: Judgment normal.      UC Treatments / Results  Labs (all labs ordered are listed, but only abnormal results are displayed) Labs Reviewed - No data to display  EKG   Radiology No results found.  Procedures Procedures (including critical care time)  Medications Ordered in UC Medications - No data to display  Initial Impression / Assessment and Plan / UC Course  I have reviewed the triage vital signs and the nursing notes.  Pertinent labs & imaging results that were available during my care of the patient were reviewed by me and considered in my medical decision making (see chart for details).   1.  Viral URI with cough Suspect viral URI, viral syndrome.  Strep/viral testing: Defer viral testing given delayed presentation to clinic and timing of illness.  Physical exam findings reassuring, vital signs hemodynamically stable, and lungs clear, therefore deferred imaging of the chest.  Low suspicion for acute cardiopulmonary abnormality/pneumonia. Advised supportive care/prescriptions for symptomatic relief as outlined in AVS.    Counseled patient on  potential for adverse effects with medications prescribed/recommended today, strict ER and return-to-clinic precautions discussed, patient verbalized understanding.    Final Clinical Impressions(s) / UC Diagnoses   Final diagnoses:  Viral URI with cough     Discharge Instructions      You have a viral illness which will improve on its own with rest, fluids, and medications to help with your symptoms.  Tylenol, guaifenesin (plain mucinex), and saline nasal sprays may help relieve symptoms.   Two teaspoons of honey in 1 cup of warm water every 4-6 hours may help with throat pains.  Humidifier in room at nighttime may help soothe cough (clean well daily).   For chest pain, shortness of breath, inability to keep food or fluids down without vomiting, fever that does not respond to tylenol or motrin , or any other severe symptoms, please go to the ER for further evaluation. Return to urgent care as needed, otherwise follow-up with PCP.       ED Prescriptions   None    PDMP not reviewed this encounter.   Enedelia Dorna HERO, FNP 12/23/23 1120

## 2023-12-23 NOTE — Discharge Instructions (Signed)

## 2023-12-23 NOTE — ED Triage Notes (Signed)
 PT has had a cough for one week. Pt has been taking OTC  Tylenol ,Ibuprofen  with out relief.

## 2024-02-05 ENCOUNTER — Other Ambulatory Visit: Payer: Self-pay

## 2024-02-05 DIAGNOSIS — I10 Essential (primary) hypertension: Secondary | ICD-10-CM

## 2024-02-05 MED ORDER — LISINOPRIL-HYDROCHLOROTHIAZIDE 20-12.5 MG PO TABS: 2.0000 | ORAL_TABLET | Freq: Every day | ORAL | 3 refills | Status: AC

## 2024-02-05 NOTE — Telephone Encounter (Signed)
 Chart reviewed. Rx refilled.
